# Patient Record
Sex: Female | Born: 1990 | Race: White | Hispanic: No | Marital: Single | Smoking: Never smoker
Health system: Southern US, Community
[De-identification: ages and names within clinical notes are randomized; demographics above are authoritative.]

## PROBLEM LIST (undated history)

## (undated) ENCOUNTER — Inpatient Hospital Stay: Payer: Self-pay

## (undated) DIAGNOSIS — J45909 Unspecified asthma, uncomplicated: Secondary | ICD-10-CM

## (undated) DIAGNOSIS — O210 Mild hyperemesis gravidarum: Secondary | ICD-10-CM

## (undated) DIAGNOSIS — E119 Type 2 diabetes mellitus without complications: Secondary | ICD-10-CM

## (undated) DIAGNOSIS — D649 Anemia, unspecified: Secondary | ICD-10-CM

## (undated) DIAGNOSIS — R519 Headache, unspecified: Secondary | ICD-10-CM

## (undated) DIAGNOSIS — R51 Headache: Secondary | ICD-10-CM

## (undated) HISTORY — PX: ABDOMINAL SURGERY: SHX537

---
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
## 2010-07-31 ENCOUNTER — Emergency Department: Payer: Self-pay | Admitting: Emergency Medicine

## 2012-02-20 ENCOUNTER — Ambulatory Visit: Payer: Self-pay | Admitting: Urology

## 2014-08-11 ENCOUNTER — Emergency Department: Payer: Self-pay | Admitting: Student

## 2014-08-11 LAB — URINALYSIS, COMPLETE
BILIRUBIN, UR: NEGATIVE
Glucose,UR: NEGATIVE mg/dL (ref 0–75)
Ketone: NEGATIVE
NITRITE: POSITIVE
Ph: 5 (ref 4.5–8.0)
Protein: 30
RBC,UR: 113 /HPF (ref 0–5)
Specific Gravity: 1.021 (ref 1.003–1.030)
Squamous Epithelial: 1

## 2014-08-11 LAB — CBC WITH DIFFERENTIAL/PLATELET
BASOS PCT: 0.5 %
Basophil #: 0.1 10*3/uL (ref 0.0–0.1)
EOS ABS: 0.1 10*3/uL (ref 0.0–0.7)
Eosinophil %: 0.8 %
HCT: 41.1 % (ref 35.0–47.0)
HGB: 13.1 g/dL (ref 12.0–16.0)
Lymphocyte #: 2.5 10*3/uL (ref 1.0–3.6)
Lymphocyte %: 17.2 %
MCH: 27.8 pg (ref 26.0–34.0)
MCHC: 31.9 g/dL — AB (ref 32.0–36.0)
MCV: 87 fL (ref 80–100)
MONO ABS: 1.1 x10 3/mm — AB (ref 0.2–0.9)
Monocyte %: 7.8 %
NEUTROS ABS: 10.8 10*3/uL — AB (ref 1.4–6.5)
Neutrophil %: 73.7 %
PLATELETS: 289 10*3/uL (ref 150–440)
RBC: 4.72 10*6/uL (ref 3.80–5.20)
RDW: 12.6 % (ref 11.5–14.5)
WBC: 14.6 10*3/uL — ABNORMAL HIGH (ref 3.6–11.0)

## 2014-08-11 LAB — BASIC METABOLIC PANEL
ANION GAP: 7 (ref 7–16)
BUN: 15 mg/dL (ref 7–18)
CO2: 25 mmol/L (ref 21–32)
CREATININE: 0.81 mg/dL (ref 0.60–1.30)
Calcium, Total: 9 mg/dL (ref 8.5–10.1)
Chloride: 107 mmol/L (ref 98–107)
EGFR (African American): >60
EGFR (Non-African Amer.): >60
Glucose: 89 mg/dL (ref 65–99)
OSMOLALITY: 278 (ref 275–301)
Potassium: 4.2 mmol/L (ref 3.5–5.1)
Sodium: 139 mmol/L (ref 136–145)

## 2014-08-12 ENCOUNTER — Ambulatory Visit: Payer: Self-pay | Admitting: Internal Medicine

## 2014-08-12 LAB — HEPATIC FUNCTION PANEL A (ARMC)
ALBUMIN: 4.2 g/dL (ref 3.4–5.0)
ALK PHOS: 69 U/L
AST: 29 U/L (ref 15–37)
BILIRUBIN TOTAL: 0.1 mg/dL — AB (ref 0.2–1.0)
SGPT (ALT): 17 U/L
Total Protein: 7.9 g/dL (ref 6.4–8.2)

## 2014-08-12 LAB — DRUG SCREEN, URINE
AMPHETAMINES, UR SCREEN: NEGATIVE (ref ?–1000)
BARBITURATES, UR SCREEN: NEGATIVE (ref ?–200)
BENZODIAZEPINE, UR SCRN: NEGATIVE (ref ?–200)
CANNABINOID 50 NG, UR ~~LOC~~: NEGATIVE (ref ?–50)
COCAINE METABOLITE, UR ~~LOC~~: NEGATIVE (ref ?–300)
MDMA (Ecstasy)Ur Screen: NEGATIVE (ref ?–500)
METHADONE, UR SCREEN: NEGATIVE (ref ?–300)
OPIATE, UR SCREEN: NEGATIVE (ref ?–300)
Phencyclidine (PCP) Ur S: NEGATIVE (ref ?–25)
Tricyclic, Ur Screen: NEGATIVE (ref ?–1000)

## 2014-08-12 LAB — URINALYSIS, COMPLETE
Bilirubin,UR: NEGATIVE
Glucose,UR: NEGATIVE
KETONE: NEGATIVE
Nitrite: POSITIVE
PH: 6 (ref 5.0–8.0)
Protein: 30
Specific Gravity: 1.025 (ref 1.000–1.030)

## 2014-08-15 LAB — URINE CULTURE

## 2014-12-12 ENCOUNTER — Ambulatory Visit: Payer: Self-pay | Admitting: Physician Assistant

## 2015-07-21 ENCOUNTER — Encounter: Payer: Self-pay | Admitting: Emergency Medicine

## 2015-07-21 ENCOUNTER — Ambulatory Visit
Admission: EM | Admit: 2015-07-21 | Discharge: 2015-07-21 | Disposition: A | Discharge status: Home / Self Care | Payer: Self-pay | Attending: Internal Medicine | Admitting: Internal Medicine

## 2015-07-21 DIAGNOSIS — R42 Dizziness and giddiness: Secondary | ICD-10-CM

## 2015-07-21 MED ORDER — MECLIZINE HCL 12.5 MG PO TABS: 25.0000 mg | ORAL_TABLET | Freq: Three times a day (TID) | ORAL | Status: DC | PRN

## 2015-07-21 NOTE — Discharge Instructions (Signed)
Prescription for antivert (meclizine) sent to the pharmacy. Recheck or followup Dr Greggory Stallion if not improving in a few days.  Dizziness Dizziness is a common problem. It is a feeling of unsteadiness or light-headedness. You may feel like you are about to faint. Dizziness can lead to injury if you stumble or fall. A person of any age group can suffer from dizziness, but dizziness is more common in older adults. CAUSES  Dizziness can be caused by many different things, including:  Middle ear problems.  Standing for too long.  Infections.  An allergic reaction.  Aging.  An emotional response to something, such as the sight of blood.  Side effects of medicines.  Tiredness.  Problems with circulation or blood pressure.  Excessive use of alcohol or medicines, or illegal drug use.  Breathing too fast (hyperventilation).  An irregular heart rhythm (arrhythmia).  A low red blood cell count (anemia).  Pregnancy.  Vomiting, diarrhea, fever, or other illnesses that cause body fluid loss (dehydration).  Diseases or conditions such as Parkinson's disease, high blood pressure (hypertension), diabetes, and thyroid problems.  Exposure to extreme heat. DIAGNOSIS  Your health care provider will ask about your symptoms, perform a physical exam, and perform an electrocardiogram (ECG) to record the electrical activity of your heart. Your health care provider may also perform other heart or blood tests to determine the cause of your dizziness. These may include:  Transthoracic echocardiogram (TTE). During echocardiography, sound waves are used to evaluate how blood flows through your heart.  Transesophageal echocardiogram (TEE).  Cardiac monitoring. This allows your health care provider to monitor your heart rate and rhythm in real time.  Holter monitor. This is a portable device that records your heartbeat and can help diagnose heart arrhythmias. It allows your health care provider to track  your heart activity for several days if needed.  Stress tests by exercise or by giving medicine that makes the heart beat faster. TREATMENT  Treatment of dizziness depends on the cause of your symptoms and can vary greatly. HOME CARE INSTRUCTIONS   Drink enough fluids to keep your urine clear or pale yellow. This is especially important in very hot weather. In older adults, it is also important in cold weather.  Take your medicine exactly as directed if your dizziness is caused by medicines. When taking blood pressure medicines, it is especially important to get up slowly.  Rise slowly from chairs and steady yourself until you feel okay.  In the morning, first sit up on the side of the bed. When you feel okay, stand slowly while holding onto something until you know your balance is fine.  Move your legs often if you need to stand in one place for a long time. Tighten and relax your muscles in your legs while standing.  Have someone stay with you for 1-2 days if dizziness continues to be a problem. Do this until you feel you are well enough to stay alone. Have the person call your health care provider if he or she notices changes in you that are concerning.  Do not drive or use heavy machinery if you feel dizzy.  Do not drink alcohol. SEEK IMMEDIATE MEDICAL CARE IF:   Your dizziness or light-headedness gets worse.  You feel nauseous or vomit.  You have problems talking, walking, or using your arms, hands, or legs.  You feel weak.  You are not thinking clearly or you have trouble forming sentences. It may take a friend or family member to  notice this.  You have chest pain, abdominal pain, shortness of breath, or sweating.  Your vision changes.  You notice any bleeding.  You have side effects from medicine that seems to be getting worse rather than better. MAKE SURE YOU:   Understand these instructions.  Will watch your condition.  Will get help right away if you are not  doing well or get worse. Document Released: 05/27/2001 Document Revised: 12/06/2013 Document Reviewed: 06/20/2011 [facility] Patient Information 2015 [place], [place]. This information is not intended to replace advice given to you by your health care provider. Make sure you discuss any questions you have with your health care provider.

## 2015-07-21 NOTE — ED Notes (Signed)
Patient presents here with c/o dizziness/ nausea since 2 days.

## 2015-07-21 NOTE — ED Provider Notes (Addendum)
CSN: [id number]     Arrival date & time 07/21/15  1358 History   First MD Initiated Contact with Patient 07/21/15 [phone]     Chief Complaint  Patient presents with  . Dizziness  . Nausea   HPI  Patient is a 24 year old lady who had the onset of dizziness yesterday. She describes this as being a sensation of kind of swimmy headedness, like being on a boat in the ocean with wave after wave. She also compares this to being like the feeling she hasn't she gets off a roller coaster. She woke up feeling dizzy in the middle of the night, severe at that time, less severe at the moment. She is able to get up and go to the bathroom, and felt more dizzy when she stood up from the toilet. Arms and legs are working, no weakness or clumsiness, was able to walk into the urgent care independently. No difficulty reported with speaking or swallowing. No fever, no rash, no achiness. Little bit of headache. Nausea, no vomiting, no abdominal pain. Change in bowel movements, no urinary difficulties reported. Patient has 2 small children at home, who have had recent respiratory symptoms and have been exposed to hand-foot-and-mouth disease  History reviewed. No pertinent past medical history. Past Surgical History  Procedure Laterality Date  . Cesarean section     gestational diabetes  History  Substance Use Topics  . Smoking status: Never Smoker   . Smokeless tobacco: Not on file  . Alcohol Use: Yes    Review of Systems  All other systems reviewed and are negative.   Allergies  Review of patient's allergies indicates no known allergies.  Home Medications   Prior to Admission medications   Medication Sig Start Date End Date Taking? Authorizing Provider  norethindrone-ethinyl estradiol-iron (ESTROSTEP FE,TILIA FE,TRI-LEGEST FE) 1-20/1-30/1-35 MG-MCG tablet Take 1 tablet by mouth daily.   Yes Historical Provider, MD          BP 117/70 mmHg  Pulse 62  Temp(Src) 96.8 F (36 C) (Tympanic)  Resp 20  Ht 5'  6" (1.676 m)  Wt 145 lb (65.772 kg)  BMI 23.41 kg/m2  SpO2 100%  LMP 07/18/2015 Physical Exam  Constitutional: She is oriented to person, place, and time. No distress.  Alert, nicely groomed Reclining on stretcher  HENT:  Head: Atraumatic.  Bilateral TMs moderately dull, no erythema Moderate left nasal congestion, marked right nasal congestion Slight erythema posterior pharynx  Eyes: Pupils are equal, round, and reactive to light.  Conjugate gaze, no eye redness/drainage  Neck: Neck supple.  No posterior midline percussion tenderness of the neck or back  Cardiovascular: Normal rate and regular rhythm.   Pulmonary/Chest: No respiratory distress. She has no wheezes. She has no rales.  Lungs clear, symmetric breath sounds  Abdominal: Soft. She exhibits no distension. There is no tenderness. There is no rebound and no guarding.  Musculoskeletal: Normal range of motion.  No leg swelling  Neurological: She is alert and oriented to person, place, and time.  Face symmetric, speech clear/coherent Dizziness does appear to be worse with head movement, eye movement  Skin: Skin is warm and dry.  No cyanosis  Nursing note and vitals reviewed.  Orthostatic VS for the past 24 hrs:  BP- Lying Pulse- Lying BP- Sitting Pulse- Sitting BP- Standing at 0 minutes Pulse- Standing at 0 minutes  07/21/15 1456 101/62 mmHg 56 117/70 mmHg 62 105/70 mmHg 64       ED Course  Procedures  MDM   1. Dizziness    Discharge Medication List as of 07/21/2015  3:33 PM    START taking these medications   Details  meclizine (ANTIVERT) 12.5 MG tablet Take 2 tablets (25 mg total) by mouth 3 (three) times daily as needed for dizziness or nausea., Starting 07/21/2015, Until Discontinued, Normal       Recheck or follow up Dr. Greggory Stallion if not improving in a few days. To ER for worsening symptoms    Eustace Moore, MD 07/21/15 1541  Eustace Moore, MD 07/21/15 [phone]

## 2016-03-02 ENCOUNTER — Emergency Department: Payer: Medicaid Other

## 2016-03-02 ENCOUNTER — Emergency Department
Admission: EM | Admit: 2016-03-02 | Discharge: 2016-03-02 | Disposition: A | Discharge status: Home / Self Care | Payer: Medicaid Other | Attending: Emergency Medicine | Admitting: Emergency Medicine

## 2016-03-02 ENCOUNTER — Encounter: Payer: Self-pay | Admitting: *Deleted

## 2016-03-02 DIAGNOSIS — O210 Mild hyperemesis gravidarum: Secondary | ICD-10-CM | POA: Diagnosis not present

## 2016-03-02 DIAGNOSIS — Z3A14 14 weeks gestation of pregnancy: Secondary | ICD-10-CM | POA: Diagnosis not present

## 2016-03-02 DIAGNOSIS — Z3492 Encounter for supervision of normal pregnancy, unspecified, second trimester: Secondary | ICD-10-CM

## 2016-03-02 DIAGNOSIS — R1110 Vomiting, unspecified: Secondary | ICD-10-CM | POA: Diagnosis present

## 2016-03-02 DIAGNOSIS — O368190 Decreased fetal movements, unspecified trimester, not applicable or unspecified: Secondary | ICD-10-CM

## 2016-03-02 HISTORY — DX: Mild hyperemesis gravidarum: O21.0

## 2016-03-02 LAB — URINALYSIS COMPLETE WITH MICROSCOPIC (ARMC ONLY)
Bilirubin Urine: NEGATIVE
GLUCOSE, UA: NEGATIVE mg/dL
NITRITE: POSITIVE — AB
Protein, ur: 30 mg/dL — AB
SPECIFIC GRAVITY, URINE: 1.015 (ref 1.005–1.030)
pH: 6 (ref 5.0–8.0)

## 2016-03-02 LAB — POCT PREGNANCY, URINE: Preg Test, Ur: POSITIVE — AB

## 2016-03-02 MED ORDER — NITROFURANTOIN MACROCRYSTAL 100 MG PO CAPS: 100.0000 mg | ORAL_CAPSULE | Freq: Four times a day (QID) | ORAL | Status: DC

## 2016-03-02 MED ORDER — SODIUM CHLORIDE 0.9 % IV BOLUS (SEPSIS)
1000.0000 mL | Freq: Once | INTRAVENOUS | Status: AC
  Administered 2016-03-02: 1000 mL via INTRAVENOUS

## 2016-03-02 MED ORDER — ONDANSETRON HCL 4 MG PO TABS: 4.0000 mg | ORAL_TABLET | Freq: Three times a day (TID) | ORAL | Status: DC | PRN

## 2016-03-02 MED ORDER — NITROFURANTOIN MONOHYD MACRO 100 MG PO CAPS
100.0000 mg | ORAL_CAPSULE | Freq: Once | ORAL | Status: AC
  Administered 2016-03-02: 100 mg via ORAL
  Filled 2016-03-02 (×2): qty 1

## 2016-03-02 MED ORDER — ONDANSETRON HCL 4 MG/2ML IJ SOLN
4.0000 mg | Freq: Once | INTRAMUSCULAR | Status: AC
  Administered 2016-03-02: 4 mg via INTRAVENOUS
  Filled 2016-03-02: qty 2

## 2016-03-02 NOTE — ED Notes (Signed)
This RN attempted to doppler fetal heart tones and was unable to get fetal heart tones using a doppler. This RN notified MD, MD ordered US at this time.

## 2016-03-02 NOTE — ED Provider Notes (Signed)
Time Seen: Approximately ----------------------------------------- 7:55 PM on 03/02/2016 -----------------------------------------   I have reviewed the triage notes  Chief Complaint: Emesis   History of Present Illness: Mary Suarez is a 25 y.o. female who states that she is [redacted] weeks pregnant. Patient's felt ill for the past week and was diagnosed with the influenza virus at an urgent care center yesterday. He states that she's had some nausea throughout most of her pregnancy but started vomiting 3 days ago. The patient states that she's had some loose stool and vomiting that started this morning. She denies any melena or hematochezia. Patient states that she's had a persistent cough and had some small amount of blood in her phlegm today. No persistent coughing up of blood and no shortness of breath. Patient states that she's had resolution of her fever. 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 The patient has been taking and anti-medic medication bedtime the medication was not familiar to me. Her other concern is that she hasn't felt any fetal movements recently. She states she did feel warm earlier in her pregnancy. She denies any history of complications with her pregnancies except for gestational diabetes. She states she is currently being followed by her OB/GYN. She has had a previous ultrasound during her pregnancy which did not show any evidence of complications.  Past Medical History  Diagnosis Date  . Morning sickness     There are no active problems to display for this patient.   Past Surgical History  Procedure Laterality Date  . Cesarean section      Past Surgical History  Procedure Laterality Date  . Cesarean section      Current Outpatient Rx  Name  Route  Sig  Dispense  Refill  . meclizine (ANTIVERT) 12.5 MG tablet   Oral   Take 2 tablets (25 mg total) by mouth 3 (three) times daily as needed for dizziness or  nausea.   30 tablet   0   . norethindrone-ethinyl estradiol-iron (ESTROSTEP FE,TILIA FE,TRI-LEGEST FE) 1-20/1-30/1-35 MG-MCG tablet   Oral   Take 1 tablet by mouth daily.           Allergies:  Phenergan  Family History: No family history on file.  Social History: Social History  Substance Use Topics  . Smoking status: Never Smoker   . Smokeless tobacco: None  . Alcohol Use: Yes     Review of Systems:   10 point review of systems was performed and was otherwise negative:  Constitutional: No fever Eyes: No visual disturbances ENT: No sore throat, ear pain Cardiac: No chest pain Respiratory: No shortness of breath, wheezing, or stridor Abdomen: No abdominal pain, no vomiting, No diarrhea Endocrine: No weight loss, No night sweats Extremities: No peripheral edema, cyanosis Skin: No rashes, easy bruising Neurologic: No focal weakness, trouble with speech or swollowing Urologic: No dysuria, Hematuria, or urinary frequency Patient denies any vaginal discharge or bleeding  Physical Exam:  ED Triage Vitals  Enc Vitals Group     BP 03/02/16 1848 118/75 mmHg     Pulse Rate 03/02/16 1848 99     Resp 03/02/16 1848 18     Temp 03/02/16 1848 98.5 F (36.9 C)     Temp Source 03/02/16 1848 Oral     SpO2 03/02/16 1848 98 %     Weight 03/02/16 1848 155 lb (70.308 kg)     Height 03/02/16 1848 5\' 6"  (1.676 m)     Head  Cir --      Peak Flow --      Pain Score 03/02/16 1850 3     Pain Loc --      Pain Edu? --      Excl. in GC? --     General: Awake , Alert , and Oriented times 3; GCS 15 nonproductive cough. No signs of respiratory distress Head: Normal cephalic , atraumatic Eyes: Pupils equal , round, reactive to light Nose/Throat: No nasal drainage, patent upper airway without erythema or exudate.  Neck: Supple, Full range of motion, No anterior adenopathy or palpable thyroid masses Lungs: Clear to ascultation without wheezes , rhonchi, or rales Heart: Regular rate,  regular rhythm without murmurs , gallops , or rubs Abdomen: Soft, non tender without rebound, guarding , or rigidity; bowel sounds positive and symmetric in all 4 quadrants. No organomegaly .        Extremities: 2 plus symmetric pulses. No edema, clubbing or cyanosis Neurologic: normal ambulation, Motor symmetric without deficits, sensory intact Skin: warm, dry, no rashes   Labs:   Patient's pregnancy test was positive and appears to have a urinary tract infection with many bacteria and positive nitrites  Radiology:    Narrative:    CLINICAL DATA: Vomiting for 3 days. Decreased fetal movement. Estimated gestational age by LMP is 14 weeks 5 days.  EXAM: LIMITED OBSTETRIC ULTRASOUND  FINDINGS: Number of Fetuses: 1  Heart Rate: 157 bpm  Movement: Yes  Presentation: Fetus is in cephalic presentation.  Placental Location: Anterior  Previa: No  Amniotic Fluid (Subjective): Within normal limits.  BPD: 2.9cm 15w tod  MATERNAL FINDINGS:  Cervix: Appears closed. Length measures 5.1 cm.  Uterus/Adnexae: Posterior contraction is suggested. No visualized myometrial masses. Limited visualization of both ovaries without evidence of abnormal adnexal mass.  IMPRESSION: Single intrauterine pregnancy with fetus in cephalic presentation. No acute complication demonstrated on limited imaging.  This exam is performed on an emergent basis and does not comprehensively evaluate fetal size, dating, or anatomy; follow-up complete OB US should be considered if further fetal assessment is warranted.     I personally reviewed the radiologic studies      ED Course: Patient's stay here was uneventful and the patient's ultrasound did not show any abnormalities with her pregnancy. Her laboratory work shows a urinary tract infection and given that she is in her second trimester of pregnancy I felt she should be started on oral antibiotics. Patient was given macrodantoin  here  and she states that she normally has urinary tract infections with her pregnancy. She'll be discharged on macrodantoin  and a urine culture is pending. The patient was advised to continue with the Tamiflu at her own discretion as this may also be contributing to her nausea and vomiting. She was advised to follow up with her OB/GYN as directed   Assessment:  Urinary tract infection Nausea and vomiting Influenza     Plan:  Outpatient management Patient was prescribed Zofran which was effective here for nausea and vomiting. She is otherwise hemodynamically stable and is advised follow-up as directed with her OB/GYN with urine culture pending Patient was advised to return immediately if condition worsens. Patient was advised to follow up with their primary care physician or other specialized physicians involved in their outpatient care. The patient and/or family member/power of attorney had laboratory results reviewed at the bedside. All questions and concerns were addressed and appropriate discharge instructions were distributed by the nursing staff.  Jennye Moccasin, MD 03/02/16 [phone]

## 2016-03-02 NOTE — ED Notes (Signed)
Patient states she was diagnosed with the flu yesterday at Urgent Care and started Tamiflu yesterday and has had 3 doses. Patient c/o vomiting and diarrhea that began this morning. Patient states she has been vomiting for 3 days, but is worse today. Patient is 15-week pregnant and states she hasn't felt the baby move all day. Patient states she also has seen blood in the phlegm today.

## 2016-03-02 NOTE — ED Notes (Signed)
NAD noted at time of D/C. Pt denies questions or concerns. Pt ambulatory to the lobby at this time.  

## 2016-03-02 NOTE — Discharge Instructions (Signed)
Morning Sickness Morning sickness is when you feel sick to your stomach (nauseous) during pregnancy. This nauseous feeling may or may not come with vomiting. It often occurs in the morning but can be a problem any time of day. Morning sickness is most common during the first trimester, but it may continue throughout pregnancy. While morning sickness is unpleasant, it is usually harmless unless you develop severe and continual vomiting (hyperemesis gravidarum). This condition requires more intense treatment.  CAUSES  The cause of morning sickness is not completely known but seems to be related to normal hormonal changes that occur in pregnancy. RISK FACTORS You are at greater risk if you:  Experienced nausea or vomiting before your pregnancy.  Had morning sickness during a previous pregnancy.  Are pregnant with more than one baby, such as twins. TREATMENT  Do not use any medicines (prescription, over-the-counter, or herbal) for morning sickness without first talking to your health care provider. Your health care provider may prescribe or recommend:  Vitamin B6 supplements.  Anti-nausea medicines.  The herbal medicine ginger. HOME CARE INSTRUCTIONS   Only take over-the-counter or prescription medicines as directed by your health care provider.  Taking multivitamins before getting pregnant can prevent or decrease the severity of morning sickness in most women.  Eat a piece of dry toast or unsalted crackers before getting out of bed in the morning.  Eat five or six small meals a day.  Eat dry and bland foods (rice, baked potato). Foods high in carbohydrates are often helpful.  Do not drink liquids with your meals. Drink liquids between meals.  Avoid greasy, fatty, and spicy foods.  Get someone to cook for you if the smell of any food causes nausea and vomiting.  If you feel nauseous after taking prenatal vitamins, take the vitamins at night or with a snack.  Snack on protein  foods (nuts, yogurt, cheese) between meals if you are hungry.  Eat unsweetened gelatins for desserts.  Wearing an acupressure wristband (worn for sea sickness) may be helpful.  Acupuncture may be helpful.  Do not smoke.  Get a humidifier to keep the air in your house free of odors.  Get plenty of fresh air. SEEK MEDICAL CARE IF:   Your home remedies are not working, and you need medicine.  You feel dizzy or lightheaded.  You are losing weight. SEEK IMMEDIATE MEDICAL CARE IF:   You have persistent and uncontrolled nausea and vomiting.  You pass out (faint). MAKE SURE YOU:  Understand these instructions.  Will watch your condition.  Will get help right away if you are not doing well or get worse.   This information is not intended to replace advice given to you by your health care provider. Make sure you discuss any questions you have with your health care provider.   Document Released: 01/22/2007 Document Revised: 12/06/2013 Document Reviewed: 05/18/2013 Elsevier Interactive Patient Education 2016 [employer]  Pregnancy and Influenza Influenza, also called the flu, is an infection of the respiratory tract. If you are pregnant, you are more likely to catch the flu. You are also more likely to have a more serious case of the flu. This is because pregnancy lowers your body's ability to fight off infections (it weakens your immune system). It also puts additional stress on your heart and lungs, which makes you more likely to have complications. Having a bad case of the flu, especially with a high fever, can be dangerous for your developing baby. It can cause you  to go into early labor. HOW DO PEOPLE GET THE FLU? The flu is caused by the influenza virus. This virus is common every year in the fall and winter. It spreads when virus particles get passed from person to person. You can get the virus if you are near a sick person who is coughing or sneezing. You can also get the  virus if you touch something that has the virus on it and then touch your face. HOW CAN I PROTECT MYSELF AGAINST THE FLU?  Get a flu shot. The best way to prevent the flu is to get a flu shot before flu season starts. The flu shot is not dangerous for your developing baby. It may even help protect your baby from the flu for up to 6 months after birth. The flu shot is one type of flu vaccine. Another type is a nasal spray vaccine. Do not get the nasal spray vaccine. It is not approved for pregnancy.  Do not come in close contact with sick people.  Do not share food, drinks, or utensils with other people.  Wash your hands often. Use hand sanitizer when soap and water are not available. WHAT SHOULD I DO IF I HAVE FLU SYMPTOMS? If you have any flu symptoms, call your health care provider right away. Flu symptoms include:  Fever or chills.  Muscle aches.  Headache.  Sore throat.  Nasal congestion.  Cough.  Feeling tired.  Loss of appetite.  Vomiting.  Diarrhea. You may be able to take an antiviral medicine to keep the flu from becoming severe and to shorten how long it lasts. WHAT SHOULD I DO AT HOME IF I AM DIAGNOSED WITH THE FLU?  Do not take any medicine, including cold or flu medicine, unless directed by your health care provider.  If you take antiviral medicine, make sure you finish it even if you start to feel better.  Drink enough fluid to keep your urine clear or pale yellow.  Get plenty of rest. WHEN WOULD I SEEK IMMEDIATE MEDICAL CARE IF I HAVE THE FLU?  You have trouble breathing.  You have chest pain.  You begin to have labor pains.  You have a high fever that does not go down after you take medicine.  You do not feel your baby move.  You have diarrhea or vomiting that will not go away.   This information is not intended to replace advice given to you by your health care provider. Make sure you discuss any questions you have with your health care  provider.   Document Released: 10/03/2008 Document Revised: 12/06/2013 Document Reviewed: 10/28/2013 Elsevier Interactive Patient Education [employer]

## 2016-03-05 LAB — URINE CULTURE

## 2016-05-10 ENCOUNTER — Observation Stay
Admission: EM | Admit: 2016-05-10 | Discharge: 2016-05-10 | Disposition: A | Discharge status: Home / Self Care | Payer: Medicaid Other | Attending: Advanced Practice Midwife | Admitting: Advanced Practice Midwife

## 2016-05-10 DIAGNOSIS — Z3A24 24 weeks gestation of pregnancy: Secondary | ICD-10-CM | POA: Insufficient documentation

## 2016-05-10 DIAGNOSIS — R112 Nausea with vomiting, unspecified: Secondary | ICD-10-CM | POA: Insufficient documentation

## 2016-05-10 DIAGNOSIS — O34219 Maternal care for unspecified type scar from previous cesarean delivery: Secondary | ICD-10-CM | POA: Insufficient documentation

## 2016-05-10 DIAGNOSIS — R197 Diarrhea, unspecified: Secondary | ICD-10-CM | POA: Insufficient documentation

## 2016-05-10 DIAGNOSIS — O9989 Other specified diseases and conditions complicating pregnancy, childbirth and the puerperium: Principal | ICD-10-CM | POA: Insufficient documentation

## 2016-05-10 DIAGNOSIS — Z888 Allergy status to other drugs, medicaments and biological substances status: Secondary | ICD-10-CM | POA: Insufficient documentation

## 2016-05-10 DIAGNOSIS — R110 Nausea: Secondary | ICD-10-CM | POA: Diagnosis present

## 2016-05-10 LAB — COMPREHENSIVE METABOLIC PANEL
ALT: 10 U/L — AB (ref 14–54)
AST: 16 U/L (ref 15–41)
Albumin: 3.3 g/dL — ABNORMAL LOW (ref 3.5–5.0)
Alkaline Phosphatase: 63 U/L (ref 38–126)
Anion gap: 7 (ref 5–15)
BUN: 7 mg/dL (ref 6–20)
CHLORIDE: 106 mmol/L (ref 101–111)
CO2: 23 mmol/L (ref 22–32)
Calcium: 8.9 mg/dL (ref 8.9–10.3)
Creatinine, Ser: 0.45 mg/dL (ref 0.44–1.00)
GFR calc Af Amer: >60 mL/min (ref >60)
GLUCOSE: 94 mg/dL (ref 65–99)
POTASSIUM: 3.9 mmol/L (ref 3.5–5.1)
Sodium: 136 mmol/L (ref 135–145)
Total Bilirubin: 0.4 mg/dL (ref 0.3–1.2)
Total Protein: 7 g/dL (ref 6.5–8.1)

## 2016-05-10 LAB — URINALYSIS COMPLETE WITH MICROSCOPIC (ARMC ONLY)
Bilirubin Urine: NEGATIVE
Glucose, UA: NEGATIVE mg/dL
KETONES UR: NEGATIVE mg/dL
Nitrite: NEGATIVE
PH: 7 (ref 5.0–8.0)
Protein, ur: NEGATIVE mg/dL
Specific Gravity, Urine: 1.008 (ref 1.005–1.030)

## 2016-05-10 MED ORDER — LACTATED RINGERS IV SOLN
INTRAVENOUS | Status: DC
  Administered 2016-05-10: 09:00:00 via INTRAVENOUS

## 2016-05-10 MED ORDER — ONDANSETRON HCL 4 MG/2ML IJ SOLN
4.0000 mg | INTRAMUSCULAR | Status: DC | PRN
  Administered 2016-05-10: 4 mg via INTRAVENOUS
  Filled 2016-05-10: qty 2

## 2016-05-10 MED ORDER — ACETAMINOPHEN 325 MG PO TABS
650.0000 mg | ORAL_TABLET | ORAL | Status: DC | PRN
  Administered 2016-05-10: 650 mg via ORAL
  Filled 2016-05-10: qty 2

## 2016-05-10 NOTE — Discharge Summary (Signed)
Obstetric History and Physical  Mary Suarez is a 25 y.o. [phone] with Estimated Date of Delivery: 08/26/16  who presents at [redacted]w[redacted]d  presenting for c/o nausea, vomiting and diarrhea since last night. Believes she has food poisoning as sx started after eating. Patient states she has been having no contractions, no vaginal bleeding, intact membranes, with active fetal movement.    Prenatal Course Source of Care: [facility]  with onset of care at 5 weeks Pregnancy complications or risks: Patient Active Problem List   Diagnosis Date Noted  . Nausea 05/10/2016   Prenatal Transfer Tool   Past Medical History  Diagnosis Date  . GDM with G1     Past Surgical History  Procedure Laterality Date  . Cesarean section      OB History  Gravida Para Term Preterm AB SAB TAB Ectopic Multiple Living  0 0 0 0 0 0 2    # Outcome Date GA Lbr Len/2nd Weight Sex Delivery Anes PTL Lv    Social History   Social History  . Marital Status: Single    Spouse Name: N/A  . Number of Children: N/A  . Years of Education: N/A   Social History Main Topics  . Smoking status: Never Smoker   . Smokeless tobacco: Not on file  . Alcohol Use: Yes  . Drug Use: No  . Sexual Activity: Not on file   Other Topics Concern  . Not on file   Social History Narrative    No family history on file.  No prescriptions prior to admission    Allergies  Allergen Reactions  . Phenergan [Promethazine Hcl] Other (See Comments)    Seizures    Review of Systems: Negative except for what is mentioned in HPI.  Physical Exam: BP 115/71 mmHg  Pulse 82  Temp(Src) 98.1 F (36.7 C) (Oral)  Resp 16  LMP 11/20/2015 (Approximate) GENERAL: Appeared uncomfortable on presentation, much improved by discharge FHT: Category:+FHTs Contractions: none   Pertinent Labs/Studies:   Results for orders placed or performed during the hospital encounter of 05/10/16 (from the past 24 hour(s))  Urinalysis complete, with  microscopic ([facility] only)     Status: Abnormal   Collection Time: 05/10/16  9:00 AM  Result Value Ref Range   Color, Urine YELLOW (A) YELLOW   APPearance HAZY (A) CLEAR   Glucose, UA NEGATIVE NEGATIVE mg/dL   Bilirubin Urine NEGATIVE NEGATIVE   Ketones, ur NEGATIVE NEGATIVE mg/dL   Specific Gravity, Urine 1.008 1.005 - 1.030   Hgb urine dipstick 2+ (A) NEGATIVE   pH 7.0 5.0 - 8.0   Protein, ur NEGATIVE NEGATIVE mg/dL   Nitrite NEGATIVE NEGATIVE   Leukocytes, UA 2+ (A) NEGATIVE   RBC / HPF 6-30 0 - 5 RBC/hpf   WBC, UA TOO NUMEROUS TO COUNT 0 - 5 WBC/hpf   Bacteria, UA RARE (A) NONE SEEN   Squamous Epithelial / LPF 6-30 (A) NONE SEEN   Mucous PRESENT   Comprehensive metabolic panel     Status: Abnormal   Collection Time: 05/10/16  9:20 AM  Result Value Ref Range   Sodium 136 135 - 145 mmol/L   Potassium 3.9 3.5 - 5.1 mmol/L   Chloride 106 101 - 111 mmol/L   CO2 23 22 - 32 mmol/L   Glucose, Bld 94 65 - 99 mg/dL   BUN 7 6 - 20 mg/dL   Creatinine, Ser 9.81 0.44 - 1.00 mg/dL   Calcium 8.9 8.9 - 19.1 mg/dL  Total Protein 7.0 6.5 - 8.1 g/dL   Albumin 3.3 (L) 3.5 - 5.0 g/dL   AST 16 15 - 41 U/L   ALT 10 (L) 14 - 54 U/L   Alkaline Phosphatase 63 38 - 126 U/L   Total Bilirubin 0.4 0.3 - 1.2 mg/dL   GFR calc non Af Amer >60 >60 mL/min   GFR calc Af Amer >60 >60 mL/min   Anion gap 7 5 - 15    Assessment : IUP at 671w4d, gastroenteritis vs food poisoning,   Plan: Pt received IV fluid bolus and IV zofran. Prior to discharge able to tolerate po. D/c home with Rx for Zofran po. Instructed to slowly advance po as tolerated To f/u as scheduled at 28 week visit or sooner prn.  Urine culture ordered - will follow per results.

## 2016-05-10 NOTE — Plan of Care (Signed)
Discharge instructions, both oral and written, given to pt.  Labor precations and dietary discussion completed.  She verbalized understanding of plan of care per CNM and RN discussion. Pt ready to leave dept in stable condition via wheelchair to home with her father.  Pt has next appt June 20 with [facility].  Ellison Carwin Zong Mcquarrie RNC

## 2016-05-10 NOTE — Discharge Instructions (Signed)
Keep scheduled next [facility] June [facility]  Please call if any questions or concerns. Eat bland food for next 24 hours and stay well hydrated with fluids especially water.  Birthplace number if any questions:  [phone]  Mary CarwinC Shanquita Ronning RNC

## 2016-05-10 NOTE — OB Triage Note (Signed)
[name] here with c/o N/V/D and low back pain with painful urination. Pt reports frequent UTIs with previous pregnancies. Reports multiple episodes of vomiting and diarrhea.

## 2016-05-12 LAB — URINE CULTURE

## 2016-06-21 ENCOUNTER — Observation Stay: Payer: Medicaid Other

## 2016-06-21 ENCOUNTER — Observation Stay
Admission: EM | Admit: 2016-06-21 | Discharge: 2016-06-23 | Disposition: A | Discharge status: Home / Self Care | Payer: Medicaid Other | Attending: Obstetrics & Gynecology | Admitting: Obstetrics & Gynecology

## 2016-06-21 ENCOUNTER — Encounter: Payer: Self-pay | Admitting: *Deleted

## 2016-06-21 DIAGNOSIS — R109 Unspecified abdominal pain: Secondary | ICD-10-CM | POA: Diagnosis not present

## 2016-06-21 DIAGNOSIS — Z79899 Other long term (current) drug therapy: Secondary | ICD-10-CM | POA: Insufficient documentation

## 2016-06-21 DIAGNOSIS — R197 Diarrhea, unspecified: Secondary | ICD-10-CM | POA: Insufficient documentation

## 2016-06-21 DIAGNOSIS — O26893 Other specified pregnancy related conditions, third trimester: Secondary | ICD-10-CM | POA: Diagnosis not present

## 2016-06-21 DIAGNOSIS — Z3A30 30 weeks gestation of pregnancy: Secondary | ICD-10-CM | POA: Diagnosis not present

## 2016-06-21 DIAGNOSIS — O2343 Unspecified infection of urinary tract in pregnancy, third trimester: Secondary | ICD-10-CM | POA: Insufficient documentation

## 2016-06-21 DIAGNOSIS — R110 Nausea: Secondary | ICD-10-CM | POA: Insufficient documentation

## 2016-06-21 DIAGNOSIS — R1031 Right lower quadrant pain: Secondary | ICD-10-CM | POA: Diagnosis present

## 2016-06-21 LAB — COMPREHENSIVE METABOLIC PANEL
ALK PHOS: 87 U/L (ref 38–126)
ALT: 10 U/L — AB (ref 14–54)
AST: 18 U/L (ref 15–41)
Albumin: 3.1 g/dL — ABNORMAL LOW (ref 3.5–5.0)
Anion gap: 9 (ref 5–15)
BILIRUBIN TOTAL: 0.4 mg/dL (ref 0.3–1.2)
BUN: 8 mg/dL (ref 6–20)
CALCIUM: 8.5 mg/dL — AB (ref 8.9–10.3)
CHLORIDE: 107 mmol/L (ref 101–111)
CO2: 21 mmol/L — ABNORMAL LOW (ref 22–32)
CREATININE: 0.44 mg/dL (ref 0.44–1.00)
Glucose, Bld: 96 mg/dL (ref 65–99)
Potassium: 3.6 mmol/L (ref 3.5–5.1)
Sodium: 137 mmol/L (ref 135–145)
TOTAL PROTEIN: 6.7 g/dL (ref 6.5–8.1)

## 2016-06-21 LAB — URINALYSIS COMPLETE WITH MICROSCOPIC (ARMC ONLY)
BILIRUBIN URINE: NEGATIVE
GLUCOSE, UA: NEGATIVE mg/dL
KETONES UR: NEGATIVE mg/dL
Nitrite: NEGATIVE
Protein, ur: NEGATIVE mg/dL
Specific Gravity, Urine: 1.008 (ref 1.005–1.030)
pH: 7 (ref 5.0–8.0)

## 2016-06-21 LAB — C DIFFICILE QUICK SCREEN W PCR REFLEX
C DIFFICILE (CDIFF) INTERP: NOT DETECTED
C DIFFICILE (CDIFF) TOXIN: NEGATIVE
C Diff antigen: NEGATIVE

## 2016-06-21 LAB — GASTROINTESTINAL PANEL BY PCR, STOOL (REPLACES STOOL CULTURE)
Adenovirus F40/41: NOT DETECTED
Astrovirus: NOT DETECTED
CYCLOSPORA CAYETANENSIS: NOT DETECTED
Campylobacter species: NOT DETECTED
Cryptosporidium: NOT DETECTED
E. COLI O157: NOT DETECTED
ENTAMOEBA HISTOLYTICA: NOT DETECTED
Enteroaggregative E coli (EAEC): NOT DETECTED
Enteropathogenic E coli (EPEC): NOT DETECTED
Enterotoxigenic E coli (ETEC): NOT DETECTED
Giardia lamblia: NOT DETECTED
Norovirus GI/GII: NOT DETECTED
Plesimonas shigelloides: NOT DETECTED
ROTAVIRUS A: NOT DETECTED
SAPOVIRUS (I, II, IV, AND V): NOT DETECTED
SHIGA LIKE TOXIN PRODUCING E COLI (STEC): NOT DETECTED
SHIGELLA/ENTEROINVASIVE E COLI (EIEC): NOT DETECTED
Salmonella species: NOT DETECTED
VIBRIO SPECIES: NOT DETECTED
Vibrio cholerae: NOT DETECTED
YERSINIA ENTEROCOLITICA: NOT DETECTED

## 2016-06-21 LAB — CBC
HEMATOCRIT: 32.5 % — AB (ref 35.0–47.0)
Hemoglobin: 11.2 g/dL — ABNORMAL LOW (ref 12.0–16.0)
MCH: 27.4 pg (ref 26.0–34.0)
MCHC: 34.4 g/dL (ref 32.0–36.0)
MCV: 79.7 fL — AB (ref 80.0–100.0)
PLATELETS: 164 10*3/uL (ref 150–440)
RBC: 4.08 MIL/uL (ref 3.80–5.20)
RDW: 14.3 % (ref 11.5–14.5)
WBC: 17.6 10*3/uL — ABNORMAL HIGH (ref 3.6–11.0)

## 2016-06-21 LAB — TYPE AND SCREEN
ABO/RH(D): O POS
Antibody Screen: NEGATIVE

## 2016-06-21 LAB — MAGNESIUM: MAGNESIUM: 1.7 mg/dL (ref 1.7–2.4)

## 2016-06-21 MED ORDER — METRONIDAZOLE 500 MG PO TABS
500.0000 mg | ORAL_TABLET | Freq: Three times a day (TID) | ORAL | Status: DC
  Administered 2016-06-21 – 2016-06-23 (×6): 500 mg via ORAL
  Filled 2016-06-21 (×6): qty 1

## 2016-06-21 MED ORDER — CEFTRIAXONE SODIUM 1 G IJ SOLR
1.0000 g | INTRAMUSCULAR | Status: DC
  Administered 2016-06-21 – 2016-06-23 (×3): 1 g via INTRAVENOUS
  Filled 2016-06-21 (×3): qty 10

## 2016-06-21 MED ORDER — ONDANSETRON HCL 4 MG/2ML IJ SOLN
4.0000 mg | Freq: Four times a day (QID) | INTRAMUSCULAR | Status: DC | PRN
  Administered 2016-06-21 (×3): 4 mg via INTRAVENOUS
  Filled 2016-06-21 (×3): qty 2

## 2016-06-21 MED ORDER — LACTATED RINGERS IV SOLN
INTRAVENOUS | Status: DC
  Administered 2016-06-21 – 2016-06-23 (×5): via INTRAVENOUS

## 2016-06-21 MED ORDER — ACETAMINOPHEN 500 MG PO TABS
1000.0000 mg | ORAL_TABLET | Freq: Four times a day (QID) | ORAL | Status: DC | PRN
  Administered 2016-06-21 – 2016-06-22 (×2): 1000 mg via ORAL
  Filled 2016-06-21 (×2): qty 2

## 2016-06-21 NOTE — H&P (Signed)
Obstetrics Admission History & Physical   Abdominal Pain   HPI:  25 y.o. G1P0 @ 7194w4d (08/26/2016, by Last Menstrual Period). Admitted on 06/21/2016:   Patient Active Problem List   Diagnosis Date Noted  . Right lower quadrant abdominal pain 06/21/2016  . Nausea 05/10/2016    Presents for pain in abdomen which is right sided; nausea and diarrhea this week as well as UTI being treated for.  Seen in office, on Immodium for diarrhea without help.  Pain now RLQ and somewhat RUQ.  Good FM.  No radiation, rebound or guarding.  No modifiers.  [facility]  PMHx:  Past Medical History  Diagnosis Date  . Morning sickness    PSHx:  Past Surgical History  Procedure Laterality Date  . Cesarean section     Medications:  Prescriptions prior to admission  Medication Sig Dispense Refill Last Dose  . loperamide (IMODIUM) 1 MG/5ML solution Take by mouth as needed for diarrhea or loose stools.   06/20/2016 at Unknown time  . nitrofurantoin (MACRODANTIN) 100 MG capsule Take 1 capsule (100 mg total) by mouth 4 (four) times daily. 28 capsule 0 06/20/2016  . norethindrone-ethinyl estradiol-iron (ESTROSTEP FE,TILIA FE,TRI-LEGEST FE) 1-20/1-30/1-35 MG-MCG tablet Take 1 tablet by mouth daily.   06/20/2016  . ranitidine (ZANTAC) 150 MG tablet Take 150 mg by mouth 2 (two) times daily.   06/20/2016 at Unknown time  . ondansetron (ZOFRAN) 4 MG tablet Take 1 tablet (4 mg total) by mouth every 8 (eight) hours as needed for nausea or vomiting. (Patient not taking: Reported on 06/21/2016) 21 tablet 0 Not Taking at Unknown time   Allergies: is allergic to phenergan. OBHx:  OB History  Gravida Para Term Preterm AB SAB TAB Ectopic Multiple Living  1             # Outcome Date GA Lbr Len/2nd Weight Sex Delivery Anes PTL Lv  1 Current              WJX:BJYNWGNF/[id number]:Negative/unremarkable except as detailed in HPI. Soc Hx: Pregnancy welcomed  Objective:   Filed Vitals:   06/21/16 0339  BP: 123/72  Pulse: 86  Temp:  98.1 F (36.7 C)  Resp: 16   General: Well nourished, well developed female in no acute distress.  Skin: Warm and dry.  Cardiovascular:Regular rate and rhythm. Respiratory: Clear to auscultation bilateral. Normal respiratory effort Abdomen: moderate, without guarding, without rebound, Gravid 30 weeks with no Uterine tenderness; pain is localized clearly to right side lateral to uterus both upper and lower quadrants Back: No CVAT Neuro/Psych: Normal mood and affect.   Pelvic exam: is not limited by body habitus EGBUS: within normal limits Vagina: within normal limits and with normal mucosa blood in the vault Cervix: CL/TH/HI Uterus: No contractions observed for 30 minutes.  Adnexa: not evaluated  EFM:FHR: 140 bpm Toco: None   Perinatal info:  Blood type: O positive Rubella- Immune Varicella -Immune TDaP Given during third trimester of this pregnancy RPR NR / HIV Neg/ HBsAg Neg   Assessment & Plan:   25 y.o. G1P0 @ 6694w4d, Admitted on 06/21/2016:  RIGHT LOWER QUADRANT ABDOMINAL PAIN with UPPER QUADRANT COMPONENT AS WELL, ASSOC w NAUSEA and DIARRHEA.  Concern for other etiology such as appendicitis.  Gastroenteritis a possibility but protracted long course all week.  No s/sx PTL.  Labs, MRI or CT (will have radiology assist with best option at this point). IVF, NPO, Zofran. Consider gen surg eval.

## 2016-06-21 NOTE — Progress Notes (Signed)
Left message for radiology for pt scan that is ordered.

## 2016-06-21 NOTE — Progress Notes (Signed)
Ms Jennette Kettleeal has been in our L&D triage/observation room since this AM, came in for diarrhea/RLQ pain x 1 week with no response to Immodium.  She had had chills but no fevers during this time.  She denies this long a term of diarrhea in the past outside of pregnancy. no bloody stools.  MRI was ordered to assess appendix.  Per radiology, the terminal ileum and appendix are normal appearing and there is no acute appendicitis.  Small bowel and colon also appear WNL. Zofran has been administered and has helped with her nausea.  She is able to tolerate food and PO intake.  Since being here baby has been on the monitor and persistently Category1 tracing with accels.  Plan:  This sounds like a colitis type picture now that appendicitis has been ruled out.   1. Discontinue fetal monitoring and transfer patient to [facility] for continued observation. 2. Collect stool for culture including C.Diff - unlikely but must be on differential.  Set up room for enteric precautions until ruled out. 3. Start Flagyl 500mg  PO TID for presumed colitis.  4. Regular diet 5. Continue OBS status for now.  If stays >24hr will make inpatient.  ----- Ranae Plumberhelsea Lauryl Seyer, MD Attending Obstetrician and Gynecologist [facility]

## 2016-06-21 NOTE — Plan of Care (Signed)
Problem: Physical Regulation: Goal: Will remain free from infection Outcome: Not Progressing Diarrhea continues  Problem: Fluid Volume: Goal: Ability to maintain a balanced intake and output will improve Outcome: Not Progressing Still requiring IVF secondary to Diarrhea  Problem: Nutrition: Goal: Adequate nutrition will be maintained Outcome: Not Progressing On Reg. Diet but continues with diarrhea  Problem: Bowel/Gastric: Goal: Will not experience complications related to bowel motility Outcome: Not Progressing Diarrhea

## 2016-06-21 NOTE — OB Triage Note (Signed)
Recvd pt from ED. Pt c/o sharp shooting pain in her upper right quadrant. Has had N/V/D since Sunday.

## 2016-06-22 DIAGNOSIS — O26893 Other specified pregnancy related conditions, third trimester: Secondary | ICD-10-CM | POA: Diagnosis not present

## 2016-06-22 LAB — CBC
HEMATOCRIT: 28.7 % — AB (ref 35.0–47.0)
HEMOGLOBIN: 9.7 g/dL — AB (ref 12.0–16.0)
MCH: 27 pg (ref 26.0–34.0)
MCHC: 34 g/dL (ref 32.0–36.0)
MCV: 79.5 fL — AB (ref 80.0–100.0)
PLATELETS: 154 10*3/uL (ref 150–440)
RBC: 3.6 MIL/uL — AB (ref 3.80–5.20)
RDW: 14.3 % (ref 11.5–14.5)
WBC: 12.1 10*3/uL — ABNORMAL HIGH (ref 3.6–11.0)

## 2016-06-22 MED ORDER — BUTALBITAL-APAP-CAFFEINE 50-325-40 MG PO TABS
2.0000 | ORAL_TABLET | ORAL | Status: DC | PRN
  Administered 2016-06-22 (×2): 2 via ORAL
  Filled 2016-06-22 (×2): qty 2

## 2016-06-22 NOTE — Plan of Care (Signed)
Problem: Fluid Volume: Goal: Ability to maintain a balanced intake and output will improve Outcome: Progressing Diarrhea is improving per Pt. Statement. WBC has decreased to 12.1

## 2016-06-22 NOTE — Progress Notes (Signed)
ANTEPARTUM PROGRESS NOTE  Mary Suarez is a 25 y.o. G3P2002 at 25w5dwith EDC: 08/26/16 who is admitted for persistent diarrhea and abdominal pan  Length of Stay:   Days. Admitted 06/21/2016  Subjective: She feels better this morning, not in as much abdominal pain, having less frequent bowel movements, but still watery diarrhea.  No vomiting since being moved to the floor, had a full meal last night.  States she has some blood in her urine.  She has a splitting right-sided frontal headache. Patient reports good fetal movement.  No uterine contractions, no bleeding and no loss of fluid per vagina.  Vitals:  BP 92/51 mmHg  Pulse 78  Temp(Src) 97.8 F (36.6 C) (Oral)  Resp 18  SpO2 99%  Physical Examination: CONSTITUTIONAL: Well-developed, well-nourished female in no acute distress.  HENT:  Normocephalic, atraumatic, External right and left ear normal. Oropharynx is clear and moist EYES: Conjunctivae and EOM are normal. Pupils are equal, round, and reactive to light. No scleral icterus.  NECK: Normal range of motion, supple, no masses SKIN: Skin is warm and dry. No rash noted. Not diaphoretic. No erythema. No pallor. [place] Alert and oriented to person, place, and time. Normal reflexes, muscle tone coordination. No cranial nerve deficit noted. PSYCHIATRIC: Normal mood and affect. Normal behavior. Normal judgment and thought content. CARDIOVASCULAR: Normal heart rate noted, regular rhythm RESPIRATORY: Effort and breath sounds normal, no problems with respiration noted, lungs clear to ascultation bilaterally MUSCULOSKELETAL: Normal range of motion. No edema and no tenderness. 2+ distal pulses. ABDOMEN: Soft, nontender, nondistended, gravid. CERVIX:  deferred  Fetal monitoring:  FHT doppler 148 Uterine activity: none  Results for orders placed or performed during the hospital encounter of 06/21/16 (from the past 48 hour(s))  Type and screen [place]     Status: None   Collection Time: 06/21/16  4:57 AM  Result Value Ref Range   ABO/RH(D) O POS    Antibody Screen NEG    Sample Expiration 06/24/2016   CBC     Status: Abnormal   Collection Time: 06/21/16  4:57 AM  Result Value Ref Range   WBC 17.6 (H) 3.6 - 11.0 K/uL   RBC 4.08 3.80 - 5.20 MIL/uL   Hemoglobin 11.2 (L) 12.0 - 16.0 g/dL   HCT 32.5 (L) 35.0 - 47.0 %   MCV 79.7 (L) 80.0 - 100.0 fL   MCH 27.4 26.0 - 34.0 pg   MCHC 34.4 32.0 - 36.0 g/dL   RDW 14.3 11.5 - 14.5 %   Platelets 164 150 - 440 K/uL  Comprehensive metabolic panel     Status: Abnormal   Collection Time: 06/21/16  4:57 AM  Result Value Ref Range   Sodium 137 135 - 145 mmol/L   Potassium 3.6 3.5 - 5.1 mmol/L   Chloride 107 101 - 111 mmol/L   CO2 21 (L) 22 - 32 mmol/L   Glucose, Bld 96 65 - 99 mg/dL   BUN 8 6 - 20 mg/dL   Creatinine, Ser 0.44 0.44 - 1.00 mg/dL   Calcium 8.5 (L) 8.9 - 10.3 mg/dL   Total Protein 6.7 6.5 - 8.1 g/dL   Albumin 3.1 (L) 3.5 - 5.0 g/dL   AST 18 15 - 41 U/L   ALT 10 (L) 14 - 54 U/L   Alkaline Phosphatase 87 38 - 126 U/L   Total Bilirubin 0.4 0.3 - 1.2 mg/dL   GFR calc non Af Amer >60 >60 mL/min   GFR  calc Af Amer >60 >60 mL/min    Comment: (NOTE) The eGFR has been calculated using the CKD EPI equation. This calculation has not been validated in all clinical situations. eGFR's persistently <60 mL/min signify possible Chronic Kidney Disease.    Anion gap 9 5 - 15  Magnesium     Status: None   Collection Time: 06/21/16  4:57 AM  Result Value Ref Range   Magnesium 1.7 1.7 - 2.4 mg/dL  Urinalysis complete, with microscopic ([facility] only)     Status: Abnormal   Collection Time: 06/21/16  5:00 AM  Result Value Ref Range   Color, Urine STRAW (A) YELLOW   APPearance HAZY (A) CLEAR   Glucose, UA NEGATIVE NEGATIVE mg/dL   Bilirubin Urine NEGATIVE NEGATIVE   Ketones, ur NEGATIVE NEGATIVE mg/dL   Specific Gravity, Urine 1.008 1.005 - 1.030   Hgb urine dipstick 2+ (A) NEGATIVE   pH 7.0  5.0 - 8.0   Protein, ur NEGATIVE NEGATIVE mg/dL   Nitrite NEGATIVE NEGATIVE   Leukocytes, UA 2+ (A) NEGATIVE   RBC / HPF 6-30 0 - 5 RBC/hpf   WBC, UA TOO NUMEROUS TO COUNT 0 - 5 WBC/hpf   Bacteria, UA RARE (A) NONE SEEN   Squamous Epithelial / LPF 0-5 (A) NONE SEEN   Mucous PRESENT   Gastrointestinal Panel by PCR , Stool     Status: None   Collection Time: 06/21/16  2:45 PM  Result Value Ref Range   Campylobacter species NOT DETECTED NOT DETECTED   Plesimonas shigelloides NOT DETECTED NOT DETECTED   Salmonella species NOT DETECTED NOT DETECTED   Yersinia enterocolitica NOT DETECTED NOT DETECTED   Vibrio species NOT DETECTED NOT DETECTED   Vibrio cholerae NOT DETECTED NOT DETECTED   Enteroaggregative E coli (EAEC) NOT DETECTED NOT DETECTED   Enteropathogenic E coli (EPEC) NOT DETECTED NOT DETECTED   Enterotoxigenic E coli (ETEC) NOT DETECTED NOT DETECTED   Shiga like toxin producing E coli (STEC) NOT DETECTED NOT DETECTED   E. coli O157 NOT DETECTED NOT DETECTED   Shigella/Enteroinvasive E coli (EIEC) NOT DETECTED NOT DETECTED   Cryptosporidium NOT DETECTED NOT DETECTED   Cyclospora cayetanensis NOT DETECTED NOT DETECTED   Entamoeba histolytica NOT DETECTED NOT DETECTED   Giardia lamblia NOT DETECTED NOT DETECTED   Adenovirus F40/41 NOT DETECTED NOT DETECTED   Astrovirus NOT DETECTED NOT DETECTED   Norovirus GI/GII NOT DETECTED NOT DETECTED   Rotavirus A NOT DETECTED NOT DETECTED   Sapovirus (I, II, IV, and V) NOT DETECTED NOT DETECTED  C difficile quick scan w PCR reflex     Status: None   Collection Time: 06/21/16  2:45 PM  Result Value Ref Range   C Diff antigen NEGATIVE NEGATIVE   C Diff toxin NEGATIVE NEGATIVE   C Diff interpretation No C. difficile detected.   CBC     Status: Abnormal   Collection Time: 06/22/16 10:32 AM  Result Value Ref Range   WBC 12.1 (H) 3.6 - 11.0 K/uL   RBC 3.60 (L) 3.80 - 5.20 MIL/uL   Hemoglobin 9.7 (L) 12.0 - 16.0 g/dL   HCT 28.7 (L) 35.0 -  47.0 %   MCV 79.5 (L) 80.0 - 100.0 fL   MCH 27.0 26.0 - 34.0 pg   MCHC 34.0 32.0 - 36.0 g/dL   RDW 14.3 11.5 - 14.5 %   Platelets 154 150 - 440 K/uL    Mr Abdomen Limited  06/21/2016  CLINICAL DATA:  Pregnant with right-sided  abdominal pain for 2 weeks. Nausea, vomiting and diarrhea. EXAM: MRI ABDOMEN AND PELVIS WITHOUT CONTRAST TECHNIQUE: Multiplanar multisequence MR imaging of the abdomen and pelvis was performed. No intravenous contrast was administered. COMPARISON:  None. FINDINGS: MRI ABDOMEN FINDINGS The solid abdominal organs are normal. The gallbladder is normal. Normal caliber and course of the common bile duct. There is mild expected right-sided hydroureteronephrosis and a duplicated right renal collecting system. No free fluid is identified. MRI PELVIS FINDINGS Gravid uterus. Patient is [redacted] weeks pregnant. The fetus appears normal. Cephalic presentation. Normal amniotic fluid volume. Normal appearing placenta. No gross abnormalities are identified. The small bowel and colon are grossly normal. I believe I can identify any terminal ileum and appendix and may appear normal. No findings for acute appendicitis. IMPRESSION: 1. No MR findings to suggest acute appendicitis. 2. Normal appearance of the gallbladder and pancreaticobiliary tree. 3. 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 4. Normal appearance of the uterus and fetus. Electronically Signed   By: Marijo Sanes M.D.   On: 06/21/2016 10:22    Current scheduled medications . cefTRIAXone (ROCEPHIN)  IV  1 g Intravenous Q24H  . metroNIDAZOLE  500 mg Oral Q8H    I have reviewed the patient's current medications.  ASSESSMENT: 25yo [id number] @ 30.5 with presumed colitis  Patient Active Problem List   Diagnosis Date Noted  . Right lower quadrant abdominal pain 06/21/2016  . Diarrhea 06/21/2016  . Nausea 05/10/2016    PLAN: 1. Was on Rocephin for urinary symptoms/pain? With new  hematuria, continue this for now until a culture returns.  Flagyl added yesterday for colitis picture although all tested stool has returned negative.  So far with improved symptoms, will continue.  Consider probiotic supplementation.  D/c enteric precautions. 2. Headache: not a routine caffeine user, so likely not withdrawal.  Will give Fioricet and see if this helps.  If not will try compazine/benadryl combo.  Lastly could try imitrex or other triptan. 3. Continue routine antenatal care.   ----- Larey Days, MD Attending Obstetrician and Gynecologist [facility]

## 2016-06-23 DIAGNOSIS — O2343 Unspecified infection of urinary tract in pregnancy, third trimester: Secondary | ICD-10-CM

## 2016-06-23 DIAGNOSIS — O26893 Other specified pregnancy related conditions, third trimester: Secondary | ICD-10-CM | POA: Diagnosis not present

## 2016-06-23 LAB — URINE CULTURE

## 2016-06-23 MED ORDER — PRENATAL VITAMIN 27-0.8 MG PO TABS: 1.0000 | ORAL_TABLET | Freq: Every day | ORAL | Status: DC

## 2016-06-23 NOTE — Procedures (Signed)
NST for decreased fetal movement  Time 20 mins FHT: 125  Variability: moderate Accelerations:  Present, >2 Decelerations: absent  Toco:  Mild contractions q314min  Interpretation: Reactive NST, Category 1 tracing  Fetal movement still decreased but above minimum; patient reassured.  ----- Ranae Plumberhelsea Ward, MD Attending Obstetrician and Gynecologist [facility]

## 2016-06-23 NOTE — Discharge Summary (Signed)
Physician Discharge Summary  Patient ID: Mary Suarez MRN: [id number] DOB/AGE: [birth year] 25 y.o.  Admit date: 06/21/2016 Discharge date: 06/23/2016  Admission Diagnoses: Abdominal pain  Discharge Diagnoses:  Principal Problem:   Abdominal pain resolving Active Problems:   Diarrhea improving, nausea   Urinary tract infection affecting care of mother in third trimester, antepartum  Discharged Condition: good Pt states she is feeling better. Her last episode of diarrhea was last night. She is still experiencing some nausea and did not tolerate the eggs she ate for breakfast, but she did eat yogurt without difficulty. She feels well enough to go home where she will continue resting.  Hospital Course: admitted for abdominal pain, diarrhea, uti, IV fluids, antibiotics, fetal monitoring  Consults: None  Significant Diagnostic Studies: labs:   Results for Mary, Suarez (MRN [id number]) as of 06/23/2016 11:17  Ref. Range 06/21/2016 04:57 06/21/2016 05:00 06/21/2016 10:41 06/21/2016 14:45 06/22/2016 10:32  Sodium Latest Ref Range: 135-145 mmol/L 137      Potassium Latest Ref Range: 3.5-5.1 mmol/L 3.6      Chloride Latest Ref Range: 101-111 mmol/L 107      CO2 Latest Ref Range: 22-32 mmol/L 21 (L)      BUN Latest Ref Range: 6-20 mg/dL 8      Creatinine Latest Ref Range: 0.44-1.00 mg/dL 0.44      Calcium Latest Ref Range: 8.9-10.3 mg/dL 8.5 (L)      EGFR (Non-African Amer.) Latest Ref Range: >60 mL/min >60      EGFR (African American) Latest Ref Range: >60 mL/min >60      Glucose Latest Ref Range: 65-99 mg/dL 96      Anion gap Latest Ref Range: 5-15  9      Magnesium Latest Ref Range: 1.7-2.4 mg/dL 1.7      Alkaline Phosphatase Latest Ref Range: 38-126 U/L 87      Albumin Latest Ref Range: 3.5-5.0 g/dL 3.1 (L)      AST Latest Ref Range: 15-41 U/L 18      ALT Latest Ref Range: 14-54 U/L 10 (L)      Total Protein Latest Ref Range: 6.5-8.1 g/dL 6.7      Total Bilirubin Latest Ref Range:  0.3-1.2 mg/dL 0.4      WBC Latest Ref Range: 3.6-11.0 K/uL 17.6 (H)    12.1 (H)  RBC Latest Ref Range: 3.80-5.20 MIL/uL 4.08    3.60 (L)  Hemoglobin Latest Ref Range: 12.0-16.0 g/dL 11.2 (L)    9.7 (L)  HCT Latest Ref Range: 35.0-47.0 % 32.5 (L)    28.7 (L)  MCV Latest Ref Range: 80.0-100.0 fL 79.7 (L)    79.5 (L)  MCH Latest Ref Range: 26.0-34.0 pg 27.4    27.0  MCHC Latest Ref Range: 32.0-36.0 g/dL 34.4    34.0  RDW Latest Ref Range: 11.5-14.5 % 14.3    14.3  Platelets Latest Ref Range: 150-440 K/uL 164    154  Sample Expiration Unknown 06/24/2016      Antibody Screen Unknown NEG      ABO/RH(D) Unknown O POS      Appearance Latest Ref Range: CLEAR   HAZY (A)     Bacteria, UA Latest Ref Range: NONE SEEN   RARE (A)     Bilirubin Urine Latest Ref Range: NEGATIVE   NEGATIVE     Color, Urine Latest Ref Range: YELLOW   STRAW (A)     Glucose Latest Ref Range: NEGATIVE mg/dL  NEGATIVE  Hgb urine dipstick Latest Ref Range: NEGATIVE   2+ (A)     Ketones, ur Latest Ref Range: NEGATIVE mg/dL  NEGATIVE     Leukocytes, UA Latest Ref Range: NEGATIVE   2+ (A)     Mucous Unknown  PRESENT     Nitrite Latest Ref Range: NEGATIVE   NEGATIVE     pH Latest Ref Range: 5.0-8.0   7.0     Protein Latest Ref Range: NEGATIVE mg/dL  NEGATIVE     RBC / HPF Latest Ref Range: 0-5 RBC/hpf  6-30     Specific Gravity, Urine Latest Ref Range: 1.005-1.030   1.008     Squamous Epithelial / LPF Latest Ref Range: NONE SEEN   0-5 (A)     WBC, UA Latest Ref Range: 0-5 WBC/hpf  TOO NUMEROUS TO C...     C Diff interpretation Unknown    No C. difficile d...   C Diff toxin Latest Ref Range: NEGATIVE     NEGATIVE   C Diff antigen Latest Ref Range: NEGATIVE     NEGATIVE   GASTROINTESTINAL PANEL BY PCR, STOOL (REPLACES STOOL CULTURE) Unknown    Rpt   URINE CULTURE Unknown  Rpt (A)     MR ABDOMEN LIMITED Unknown   Rpt      Treatments: IV hydration and antibiotics: Rocephin  Discharge Exam: Blood pressure 105/69, pulse 69,  temperature 97.8 F (36.6 C), temperature source Oral, resp. rate 18, height '5\' 6"'$  (1.676 m), weight 78.926 kg (174 lb), last menstrual period 11/20/2015, SpO2 100 %. General appearance: alert, cooperative, appears stated age and no distress Resp: clear to auscultation bilaterally Cardio: regular rate and rhythm, S1, S2 normal, no murmur, click, rub or gallop GI: gravid, soft, non-tender; bowel sounds normal; no masses,  no organomegaly  Disposition: 01-Home or Self Care      Discharge Instructions    Discharge activity:  No Restrictions    Complete by:  As directed      Discharge diet:  Bland diet and advance as tolerated, hydrate with H2O  Complete by:  As directed      Fetal Kick Count:  Lie on our left side for one hour after a meal, and count the number of times your baby kicks.  If it is less than 5 times, get up, move around and drink some juice.  Repeat the test 30 minutes later.  If it is still less than 5 kicks in an hour, notify your doctor.    Complete by:  As directed      No sexual activity restrictions    Complete by:  As directed      Notify physician for a general feeling that "something is not right"    Complete by:  As directed      Notify physician for increase or change in vaginal discharge    Complete by:  As directed      Notify physician for intestinal cramps, with or without diarrhea, sometimes described as "gas pain"    Complete by:  As directed      Notify physician for leaking of fluid    Complete by:  As directed      Notify physician for low, dull backache, unrelieved by heat or Tylenol    Complete by:  As directed      Notify physician for menstrual like cramps    Complete by:  As directed      Notify physician for pelvic pressure  Complete by:  As directed      Notify physician for uterine contractions.  These may be painless and feel like the uterus is tightening or the baby is  "balling up"    Complete by:  As directed      Notify physician for  vaginal bleeding    Complete by:  As directed      PRETERM LABOR:  Includes any of the follwing symptoms that occur between 20 - [redacted] weeks gestation.  If these symptoms are not stopped, preterm labor can result in preterm delivery, placing your baby at risk    Complete by:  As directed              Medication List    STOP taking these medications        loperamide 1 MG/5ML solution  Commonly known as:  IMODIUM     nitrofurantoin 100 MG capsule  Commonly known as:  MACRODANTIN     norethindrone-ethinyl estradiol-iron 1-20/1-30/1-35 MG-MCG tablet  Commonly known as:  ESTROSTEP FE,TILIA FE,TRI-LEGEST FE     ondansetron 4 MG tablet  Commonly known as:  ZOFRAN     ranitidine 150 MG tablet  Commonly known as:  ZANTAC      TAKE these medications        Prenatal Vitamin 27-0.8 MG Tabs  Take 1 tablet by mouth daily.      Ampicillin 500 mg q 6hrs for 7 days  Follow-up Information    Go to to follow up.   Why:  regular schedules prenatal appointment      Signed: Rod Can, CNM

## 2016-06-23 NOTE — Progress Notes (Signed)
D/C order from Tresea MallJane Gledhill CNM.  Reviewed d/c instructions and prescriptions with patient and answered any questions.  Patient d/c home via wheelchair by auxillary

## 2016-07-11 ENCOUNTER — Other Ambulatory Visit
Admission: RE | Admit: 2016-07-11 | Discharge: 2016-07-11 | Disposition: A | Discharge status: Home / Self Care | Payer: Medicaid Other | Source: Ambulatory Visit | Attending: Gastroenterology | Admitting: Gastroenterology

## 2016-07-11 DIAGNOSIS — Z029 Encounter for administrative examinations, unspecified: Secondary | ICD-10-CM | POA: Diagnosis present

## 2016-07-18 ENCOUNTER — Encounter: Payer: Self-pay | Admitting: Gastroenterology

## 2016-07-18 LAB — MISCELLANEOUS TEST

## 2016-07-29 ENCOUNTER — Observation Stay
Admission: EM | Admit: 2016-07-29 | Discharge: 2016-07-29 | Disposition: A | Discharge status: Home / Self Care | Payer: Medicaid Other | Attending: Obstetrics & Gynecology | Admitting: Obstetrics & Gynecology

## 2016-07-29 DIAGNOSIS — Z3A36 36 weeks gestation of pregnancy: Secondary | ICD-10-CM | POA: Diagnosis not present

## 2016-07-29 DIAGNOSIS — O26899 Other specified pregnancy related conditions, unspecified trimester: Secondary | ICD-10-CM

## 2016-07-29 DIAGNOSIS — R109 Unspecified abdominal pain: Secondary | ICD-10-CM

## 2016-07-29 HISTORY — DX: Anemia, unspecified: D64.9

## 2016-07-29 MED ORDER — LACTATED RINGERS IV SOLN
INTRAVENOUS | Status: DC
  Administered 2016-07-29: 02:00:00 via INTRAVENOUS

## 2016-07-29 MED ORDER — LACTATED RINGERS IV BOLUS (SEPSIS)
500.0000 mL | Freq: Once | INTRAVENOUS | Status: AC
  Administered 2016-07-29: 1000 mL via INTRAVENOUS

## 2016-07-29 MED ORDER — MORPHINE SULFATE (PF) 2 MG/ML IV SOLN: 2.0000 mg | INTRAVENOUS | Status: DC | PRN

## 2016-07-29 NOTE — H&P (Signed)
Obstetric History and Physical  Mary GrapesCasey Amanda Suarez is a 25 y.o. [id number] with Estimated Date of Delivery: 08/26/16  who presents at 3698w0d  presenting for leaking fluid and contractions. Patient states she had large gush of fluid on bed and running down her leg. Reports contractions started after this. Denies vaginal bleeding with active fetal movement.    Prenatal Course Source of Care: [facility]  Pregnancy complications or risks: None per pt, records not available today Patient Active Problem List   Diagnosis Date Noted  . Abdominal pain affecting pregnancy 07/29/2016  . Urinary tract infection affecting care of mother in third trimester, antepartum 06/23/2016  . Right lower quadrant abdominal pain 06/21/2016  . Diarrhea 06/21/2016  . Nausea 05/10/2016      Prenatal Transfer Tool   Past Medical History:  Diagnosis Date  . Anemia   . Morning sickness     Past Surgical History:  Procedure Laterality Date  . CESAREAN SECTION      OB History  Gravida Para Term Preterm AB Living  3 2 2     2   SAB TAB Ectopic Multiple Live Births          2    # Outcome Date GA Lbr Len/2nd Weight Sex Delivery Anes PTL Lv  3 Current           2 Term 06/30/13    F CS-LTranv   LIV  1 Term 09/09/11    F CS-LTranv   LIV      Social History   Social History  . Marital status: Single    Spouse name: N/A  . Number of children: N/A  . Years of education: N/A   Social History Main Topics  . Smoking status: Never Smoker  . Smokeless tobacco: Never Used  . Alcohol use No  . Drug use: No  . Sexual activity: Not Asked   Other Topics Concern  . None   Social History Narrative  . None    No family history on file.  Prescriptions Prior to Admission  Medication Sig Dispense Refill Last Dose  . Prenatal Vit-Fe Fumarate-FA (PRENATAL VITAMIN) 27-0.8 MG TABS Take 1 tablet by mouth daily. 30 tablet 3     Allergies  Allergen Reactions  . Phenergan [Promethazine Hcl] Other (See Comments)   Seizures    Review of Systems: Negative except for what is mentioned in HPI.  Physical Exam: BP 129/73   Pulse 96   Temp 98.9 F (37.2 C) (Oral)   Resp 17   Ht 5\' 6"  (1.676 m)   Wt 183 lb (83 kg)   LMP 11/20/2015 (Approximate)   BMI 29.54 kg/m  GENERAL: Well-developed, well-nourished female in no acute distress.  LUNGS: Non-labored breathing ABDOMEN: Soft, nontender, nondistended, gravid. EXTREMITIES: Nontender, no edema Cervical Exam: Dilatation 0cm   Effacement 0%   Station -2   Speculum exam: neg pooling, nitrizine and ferning Presentation: cephalic FHT: Category: 1 Baseline rate 130 bpm   Variability moderate  Accelerations present   Decelerations none Contractions: Every 2-3 mins   Pertinent Labs/Studies:   No results found for this or any previous visit (from the past 24 hour(s)).  Assessment : IUP at 4598w0d, intact membranes, preterm contractions  Plan: Observe for cervical change  IV fluid bolus, IV pain meds prn and terbutaline if contractions continue.

## 2016-07-29 NOTE — OB Triage Note (Signed)
Patient presented to L&D complaining of leaking fluid.  Patient states she woke up with a large wet area in the bed and continued to have a gush of fluid after she got up.  Denies any vaginal bleeding or decreased fetal movement. States she has had intercourse recently.

## 2016-07-29 NOTE — Progress Notes (Signed)
Discharge instructions reviewed with patient who verbalized understanding.  Patient discharged home in stable ambulatory condition.

## 2016-07-29 NOTE — ED Notes (Signed)
Pt to obs 2; Dr Tiburcio PeaHarris

## 2016-07-29 NOTE — ED Notes (Signed)
Pt to obs 3; Dr Valentino Saxonherry

## 2016-07-29 NOTE — Discharge Summary (Signed)
S: contractions unchanged after IV fluid bolus. Pt declined pain medication or tocolytics.   O: Cervix closed, VSS, FHR: cat 1 tracing, toco: q 3-4 min, mild  A: IUP at 6973w0d, no evidence of preterm labor or PROM  P: Reviewed options of 1. Continued monitoring, 2. Procardia/terbutaline/IV pain medication 3. Discharge home.  Pt reports contractions are now more mild and opts to be discharged home.  [facility]

## 2016-08-20 ENCOUNTER — Encounter
Admission: RE | Admit: 2016-08-20 | Discharge: 2016-08-20 | Disposition: A | Discharge status: Home / Self Care | Payer: Medicaid Other | Source: Ambulatory Visit | Attending: Obstetrics and Gynecology | Admitting: Obstetrics and Gynecology

## 2016-08-20 HISTORY — DX: Type 2 diabetes mellitus without complications: E11.9

## 2016-08-20 HISTORY — DX: Headache: R51

## 2016-08-20 HISTORY — DX: Unspecified asthma, uncomplicated: J45.909

## 2016-08-20 HISTORY — DX: Headache, unspecified: R51.9

## 2016-08-20 LAB — DIFFERENTIAL
BASOS PCT: 0 %
Basophils Absolute: 0 10*3/uL (ref 0–0.1)
EOS PCT: 1 %
Eosinophils Absolute: 0.1 10*3/uL (ref 0–0.7)
Lymphocytes Relative: 16 %
Lymphs Abs: 2 10*3/uL (ref 1.0–3.6)
MONO ABS: 0.9 10*3/uL (ref 0.2–0.9)
Monocytes Relative: 7 %
Neutro Abs: 9.6 10*3/uL — ABNORMAL HIGH (ref 1.4–6.5)
Neutrophils Relative %: 76 %

## 2016-08-20 LAB — TYPE AND SCREEN
ABO/RH(D): O POS
Antibody Screen: NEGATIVE
Extend sample reason: UNDETERMINED

## 2016-08-20 LAB — CBC
HCT: 32.1 % — ABNORMAL LOW (ref 35.0–47.0)
Hemoglobin: 10.8 g/dL — ABNORMAL LOW (ref 12.0–16.0)
MCH: 25.3 pg — AB (ref 26.0–34.0)
MCHC: 33.6 g/dL (ref 32.0–36.0)
MCV: 75.3 fL — AB (ref 80.0–100.0)
PLATELETS: 173 10*3/uL (ref 150–440)
RBC: 4.26 MIL/uL (ref 3.80–5.20)
RDW: 17 % — AB (ref 11.5–14.5)
WBC: 12.5 10*3/uL — AB (ref 3.6–11.0)

## 2016-08-20 LAB — RAPID HIV SCREEN (HIV 1/2 AB+AG)
HIV 1/2 Antibodies: NONREACTIVE
HIV-1 P24 ANTIGEN - HIV24: NONREACTIVE

## 2016-08-20 MED ORDER — CEFAZOLIN SODIUM-DEXTROSE 2-4 GM/100ML-% IV SOLN
2.0000 g | INTRAVENOUS | Status: AC
  Administered 2016-08-21: 2 g via INTRAVENOUS
  Filled 2016-08-20: qty 100

## 2016-08-20 NOTE — Patient Instructions (Signed)
  Your procedure is scheduled on:August 21, 2016 (Thursday) Report to EMERGENCY DEPARTMENT To find out your arrival time please call [phone] between 1PM - 3PM on ARRIVAL TIME 5:30 AM  Remember: Instructions that are not followed completely may result in serious medical risk, up to and including death, or upon the discretion of your surgeon and anesthesiologist your surgery may need to be rescheduled.    _x___ 1. Do not eat food or drink liquids after midnight. No gum chewing or hard candies.     _x___ 2. No Alcohol for 24 hours before or after surgery.   x__3. No Smoking for 24 prior to surgery.   ____  4. Bring all medications with you on the day of surgery if instructed.    __x__ 5. Notify your doctor if there is any change in your medical condition     (cold, fever, infections).     Do not wear jewelry, make-up, hairpins, clips or nail polish.  Do not wear lotions, powders, or perfumes. You may wear deodorant.  Do not shave 48 hours prior to surgery. Men may shave face and neck.  Do not bring valuables to the hospital.    [facility] is not responsible for any belongings or valuables.               Contacts, dentures or bridgework may not be worn into surgery.  Leave your suitcase in the car. After surgery it may be brought to your room.  For patients admitted to the hospital, discharge time is determined by your treatment team.   Patients discharged the day of surgery will not be allowed to drive home.    Please read over the following fact sheets that you were given:   [facility] Preparing for Surgery and or MRSA Information   ____ Take these medicines the morning of surgery with A SIP OF WATER:    1.   2.  3.  4.  5.  6.  ____ Fleet Enema (as directed)   _x___ Use CHG Soap or sage wipes as directed on instruction sheet   ____ Use inhalers on the day of surgery and bring to hospital day of surgery  ____ Stop metformin 2 days prior to surgery    ____  Take 1/2 of usual insulin dose the night before surgery and none on the morning of surgery.            _x_ Stop aspirin or coumadin, or plavix (NO ASPIRIN)  _x__ Stop Anti-inflammatories such as Advil, Aleve, Ibuprofen, Motrin, Naproxen,          Naprosyn, Goodies powders or aspirin products. Ok to take Tylenol.   ____ Stop supplements until after surgery.    ____ Bring C-Pap to the hospital.

## 2016-08-21 ENCOUNTER — Inpatient Hospital Stay: Payer: Medicaid Other | Admitting: Anesthesiology

## 2016-08-21 ENCOUNTER — Encounter
Admission: RE | Disposition: A | Discharge status: Home / Self Care | Payer: Self-pay | Source: Ambulatory Visit | Attending: Obstetrics and Gynecology

## 2016-08-21 ENCOUNTER — Inpatient Hospital Stay
Admission: RE | Admit: 2016-08-21 | Discharge: 2016-08-24 | DRG: 765 | Disposition: A | Discharge status: Home / Self Care | Payer: Medicaid Other | Source: Ambulatory Visit | Attending: Obstetrics and Gynecology | Admitting: Obstetrics and Gynecology

## 2016-08-21 DIAGNOSIS — O34211 Maternal care for low transverse scar from previous cesarean delivery: Secondary | ICD-10-CM | POA: Diagnosis present

## 2016-08-21 DIAGNOSIS — O34219 Maternal care for unspecified type scar from previous cesarean delivery: Secondary | ICD-10-CM | POA: Diagnosis present

## 2016-08-21 DIAGNOSIS — R1031 Right lower quadrant pain: Secondary | ICD-10-CM

## 2016-08-21 DIAGNOSIS — Z3A39 39 weeks gestation of pregnancy: Secondary | ICD-10-CM | POA: Diagnosis not present

## 2016-08-21 DIAGNOSIS — N736 Female pelvic peritoneal adhesions (postinfective): Secondary | ICD-10-CM | POA: Diagnosis present

## 2016-08-21 DIAGNOSIS — D62 Acute posthemorrhagic anemia: Secondary | ICD-10-CM | POA: Diagnosis present

## 2016-08-21 DIAGNOSIS — O9902 Anemia complicating childbirth: Secondary | ICD-10-CM | POA: Diagnosis present

## 2016-08-21 DIAGNOSIS — Z98891 History of uterine scar from previous surgery: Secondary | ICD-10-CM

## 2016-08-21 DIAGNOSIS — O09893 Supervision of other high risk pregnancies, third trimester: Secondary | ICD-10-CM

## 2016-08-21 LAB — RPR: RPR: NONREACTIVE

## 2016-08-21 SURGERY — Surgical Case
Anesthesia: Spinal | Wound class: Clean Contaminated

## 2016-08-21 MED ORDER — NALBUPHINE HCL 10 MG/ML IJ SOLN: 5.0000 mg | Freq: Once | INTRAMUSCULAR | Status: DC | PRN

## 2016-08-21 MED ORDER — MENTHOL 3 MG MT LOZG
1.0000 | LOZENGE | OROMUCOSAL | Status: DC | PRN
  Filled 2016-08-21: qty 9

## 2016-08-21 MED ORDER — SOD CITRATE-CITRIC ACID 500-334 MG/5ML PO SOLN
30.0000 mL | ORAL | Status: AC
  Administered 2016-08-21: 30 mL via ORAL
  Filled 2016-08-21: qty 15

## 2016-08-21 MED ORDER — FERROUS SULFATE 325 (65 FE) MG PO TABS
325.0000 mg | ORAL_TABLET | Freq: Two times a day (BID) | ORAL | Status: DC
  Administered 2016-08-21 – 2016-08-24 (×6): 325 mg via ORAL
  Filled 2016-08-21 (×6): qty 1

## 2016-08-21 MED ORDER — SODIUM CHLORIDE 0.9% FLUSH: 3.0000 mL | INTRAVENOUS | Status: DC | PRN

## 2016-08-21 MED ORDER — OXYCODONE HCL 5 MG PO TABS
5.0000 mg | ORAL_TABLET | ORAL | Status: DC | PRN
  Administered 2016-08-21: 10 mg via ORAL
  Administered 2016-08-21: 5 mg via ORAL
  Administered 2016-08-22 (×2): 10 mg via ORAL
  Filled 2016-08-21: qty 2
  Filled 2016-08-21: qty 1
  Filled 2016-08-21 (×3): qty 2

## 2016-08-21 MED ORDER — LACTATED RINGERS IV SOLN
INTRAVENOUS | Status: DC
  Administered 2016-08-21: 07:00:00 via INTRAVENOUS

## 2016-08-21 MED ORDER — OXYCODONE-ACETAMINOPHEN 5-325 MG PO TABS
2.0000 | ORAL_TABLET | ORAL | Status: DC | PRN
  Administered 2016-08-22 – 2016-08-23 (×8): 2 via ORAL
  Filled 2016-08-21 (×8): qty 2

## 2016-08-21 MED ORDER — OXYTOCIN 40 UNITS IN LACTATED RINGERS INFUSION - SIMPLE MED: 2.5000 [IU]/h | INTRAVENOUS | Status: AC

## 2016-08-21 MED ORDER — PHENYLEPHRINE HCL 10 MG/ML IJ SOLN
INTRAMUSCULAR | Status: DC | PRN
  Administered 2016-08-21 (×2): 100 ug via INTRAVENOUS

## 2016-08-21 MED ORDER — HYDROMORPHONE HCL 1 MG/ML IJ SOLN
INTRAMUSCULAR | Status: DC | PRN
  Administered 2016-08-21: .05 mg via SUBCUTANEOUS

## 2016-08-21 MED ORDER — BUPIVACAINE IN DEXTROSE 0.75-8.25 % IT SOLN
INTRATHECAL | Status: DC | PRN
  Administered 2016-08-21: 1.7 mL via INTRATHECAL

## 2016-08-21 MED ORDER — BUPIVACAINE 0.25 % ON-Q PUMP DUAL CATH 400 ML
400.0000 mL | INJECTION | Status: DC
  Filled 2016-08-21: qty 400

## 2016-08-21 MED ORDER — PRENATAL MULTIVITAMIN CH
1.0000 | ORAL_TABLET | Freq: Every day | ORAL | Status: DC
  Administered 2016-08-22 – 2016-08-24 (×3): 1 via ORAL
  Filled 2016-08-21 (×3): qty 1

## 2016-08-21 MED ORDER — ONDANSETRON HCL 4 MG/2ML IJ SOLN
4.0000 mg | Freq: Once | INTRAMUSCULAR | Status: AC | PRN
  Administered 2016-08-21: 4 mg via INTRAVENOUS

## 2016-08-21 MED ORDER — METOCLOPRAMIDE HCL 5 MG/ML IJ SOLN
10.0000 mg | Freq: Once | INTRAMUSCULAR | Status: AC
  Administered 2016-08-21: 10 mg via INTRAVENOUS
  Filled 2016-08-21: qty 2

## 2016-08-21 MED ORDER — BUPIVACAINE ON-Q PAIN PUMP (FOR ORDER SET NO CHG): INJECTION | Status: DC

## 2016-08-21 MED ORDER — BUPIVACAINE HCL (PF) 0.5 % IJ SOLN: 5.0000 mL | Freq: Once | INTRAMUSCULAR | Status: DC

## 2016-08-21 MED ORDER — COCONUT OIL OIL: 1.0000 "application " | TOPICAL_OIL | Status: DC | PRN

## 2016-08-21 MED ORDER — SENNOSIDES-DOCUSATE SODIUM 8.6-50 MG PO TABS
2.0000 | ORAL_TABLET | ORAL | Status: DC
  Administered 2016-08-21 – 2016-08-23 (×3): 2 via ORAL
  Filled 2016-08-21 (×3): qty 2

## 2016-08-21 MED ORDER — NALOXONE HCL 0.4 MG/ML IJ SOLN: 0.4000 mg | INTRAMUSCULAR | Status: DC | PRN

## 2016-08-21 MED ORDER — DIPHENHYDRAMINE HCL 25 MG PO CAPS: 25.0000 mg | ORAL_CAPSULE | Freq: Four times a day (QID) | ORAL | Status: DC | PRN

## 2016-08-21 MED ORDER — NALOXONE HCL 2 MG/2ML IJ SOSY
1.0000 ug/kg/h | PREFILLED_SYRINGE | INTRAVENOUS | Status: DC | PRN
  Filled 2016-08-21: qty 2

## 2016-08-21 MED ORDER — LACTATED RINGERS IV SOLN
INTRAVENOUS | Status: DC
  Administered 2016-08-21: 13:00:00 via INTRAVENOUS

## 2016-08-21 MED ORDER — ONDANSETRON HCL 4 MG/2ML IJ SOLN
4.0000 mg | Freq: Three times a day (TID) | INTRAMUSCULAR | Status: DC | PRN
  Administered 2016-08-21: 4 mg via INTRAVENOUS
  Filled 2016-08-21: qty 2

## 2016-08-21 MED ORDER — IBUPROFEN 600 MG PO TABS
600.0000 mg | ORAL_TABLET | Freq: Four times a day (QID) | ORAL | Status: DC
  Administered 2016-08-22 – 2016-08-24 (×9): 600 mg via ORAL
  Filled 2016-08-21 (×9): qty 1

## 2016-08-21 MED ORDER — FENTANYL CITRATE (PF) 100 MCG/2ML IJ SOLN
INTRAMUSCULAR | Status: DC | PRN
  Administered 2016-08-21: 15 ug via INTRATHECAL

## 2016-08-21 MED ORDER — SCOPOLAMINE 1 MG/3DAYS TD PT72
1.0000 | MEDICATED_PATCH | TRANSDERMAL | Status: DC
  Administered 2016-08-21: 1.5 mg via TRANSDERMAL
  Filled 2016-08-21: qty 1

## 2016-08-21 MED ORDER — ONDANSETRON HCL 4 MG/2ML IJ SOLN
INTRAMUSCULAR | Status: AC
  Administered 2016-08-21: 4 mg via INTRAVENOUS
  Filled 2016-08-21: qty 2

## 2016-08-21 MED ORDER — SIMETHICONE 80 MG PO CHEW
80.0000 mg | CHEWABLE_TABLET | Freq: Three times a day (TID) | ORAL | Status: DC
Start: 2016-08-21 — End: 2016-08-24
  Administered 2016-08-21 – 2016-08-24 (×9): 80 mg via ORAL
  Filled 2016-08-21 (×9): qty 1

## 2016-08-21 MED ORDER — DEXAMETHASONE SODIUM PHOSPHATE 10 MG/ML IJ SOLN
10.0000 mg | Freq: Once | INTRAMUSCULAR | Status: AC
  Administered 2016-08-21: 10 mg via INTRAVENOUS
  Filled 2016-08-21: qty 1

## 2016-08-21 MED ORDER — WITCH HAZEL-GLYCERIN EX PADS: 1.0000 "application " | MEDICATED_PAD | CUTANEOUS | Status: DC | PRN

## 2016-08-21 MED ORDER — NALBUPHINE HCL 10 MG/ML IJ SOLN: 5.0000 mg | INTRAMUSCULAR | Status: DC | PRN

## 2016-08-21 MED ORDER — DIBUCAINE 1 % RE OINT: 1.0000 "application " | TOPICAL_OINTMENT | RECTAL | Status: DC | PRN

## 2016-08-21 MED ORDER — BUPIVACAINE HCL (PF) 0.5 % IJ SOLN
5.0000 mL | Freq: Once | INTRAMUSCULAR | Status: DC
  Filled 2016-08-21: qty 30

## 2016-08-21 MED ORDER — LACTATED RINGERS IV SOLN
Freq: Once | INTRAVENOUS | Status: AC
  Administered 2016-08-21: 06:00:00 via INTRAVENOUS

## 2016-08-21 MED ORDER — KETOROLAC TROMETHAMINE 30 MG/ML IJ SOLN: 30.0000 mg | Freq: Four times a day (QID) | INTRAMUSCULAR | Status: AC | PRN

## 2016-08-21 MED ORDER — KETOROLAC TROMETHAMINE 30 MG/ML IJ SOLN
30.0000 mg | Freq: Four times a day (QID) | INTRAMUSCULAR | Status: AC | PRN
  Administered 2016-08-21 (×3): 30 mg via INTRAVENOUS
  Filled 2016-08-21 (×3): qty 1

## 2016-08-21 MED ORDER — FENTANYL CITRATE (PF) 100 MCG/2ML IJ SOLN: 25.0000 ug | INTRAMUSCULAR | Status: DC | PRN

## 2016-08-21 MED ORDER — ONDANSETRON HCL 4 MG/2ML IJ SOLN
INTRAMUSCULAR | Status: DC | PRN
  Administered 2016-08-21: 8 mg via INTRAVENOUS

## 2016-08-21 MED ORDER — BUPIVACAINE HCL 0.5 % IJ SOLN
INTRAMUSCULAR | Status: DC | PRN
  Administered 2016-08-21: 10 mL

## 2016-08-21 MED ORDER — DIPHENHYDRAMINE HCL 25 MG PO CAPS: 25.0000 mg | ORAL_CAPSULE | ORAL | Status: DC | PRN

## 2016-08-21 MED ORDER — DIPHENHYDRAMINE HCL 50 MG/ML IJ SOLN: 12.5000 mg | INTRAMUSCULAR | Status: DC | PRN

## 2016-08-21 MED ORDER — EPHEDRINE SULFATE-NACL 50-0.9 MG/10ML-% IV SOSY
PREFILLED_SYRINGE | INTRAVENOUS | Status: DC | PRN
  Administered 2016-08-21 (×3): 5 mg via INTRAVENOUS

## 2016-08-21 MED ORDER — OXYTOCIN 40 UNITS IN LACTATED RINGERS INFUSION - SIMPLE MED
INTRAVENOUS | Status: DC | PRN
  Administered 2016-08-21: 700 mL via INTRAVENOUS

## 2016-08-21 MED ORDER — MEPERIDINE HCL 25 MG/ML IJ SOLN: 6.2500 mg | INTRAMUSCULAR | Status: DC | PRN

## 2016-08-21 MED ORDER — OXYCODONE-ACETAMINOPHEN 5-325 MG PO TABS
1.0000 | ORAL_TABLET | ORAL | Status: DC | PRN
  Administered 2016-08-23 – 2016-08-24 (×4): 1 via ORAL
  Filled 2016-08-21 (×4): qty 1

## 2016-08-21 SURGICAL SUPPLY — 32 items
CANISTER SUCT 3000ML (MISCELLANEOUS) ×3 IMPLANT
CATH KIT ON-Q SILVERSOAK 5IN (CATHETERS) ×6 IMPLANT
CLOSURE WOUND 1/2 X4 (GAUZE/BANDAGES/DRESSINGS) ×1
DRSG OPSITE POSTOP 4X10 (GAUZE/BANDAGES/DRESSINGS) ×3 IMPLANT
DRSG TELFA 3X8 NADH (GAUZE/BANDAGES/DRESSINGS) ×3 IMPLANT
ELECT CAUTERY BLADE 6.4 (BLADE) IMPLANT
ELECT REM PT RETURN 9FT ADLT (ELECTROSURGICAL) ×3
ELECTRODE REM PT RTRN 9FT ADLT (ELECTROSURGICAL) ×1 IMPLANT
GAUZE SPONGE 4X4 12PLY STRL (GAUZE/BANDAGES/DRESSINGS) ×3 IMPLANT
GLOVE BIO SURGEON STRL SZ7 (GLOVE) ×12 IMPLANT
GLOVE INDICATOR 7.5 STRL GRN (GLOVE) ×12 IMPLANT
GOWN STRL REUS W/ TWL LRG LVL3 (GOWN DISPOSABLE) ×3 IMPLANT
GOWN STRL REUS W/TWL LRG LVL3 (GOWN DISPOSABLE) ×6
LIQUID BAND (GAUZE/BANDAGES/DRESSINGS) ×6 IMPLANT
NS IRRIG 1000ML POUR BTL (IV SOLUTION) ×3 IMPLANT
PACK C SECTION AR (MISCELLANEOUS) ×3 IMPLANT
PAD OB MATERNITY 4.3X12.25 (PERSONAL CARE ITEMS) ×6 IMPLANT
PAD PREP 24X41 OB/GYN DISP (PERSONAL CARE ITEMS) ×3 IMPLANT
SPONGE LAP 18X18 5 PK (GAUZE/BANDAGES/DRESSINGS) IMPLANT
STRIP CLOSURE SKIN 1/2X4 (GAUZE/BANDAGES/DRESSINGS) ×2 IMPLANT
SUCT VACUUM KIWI BELL (SUCTIONS) ×3 IMPLANT
SUT CHROMIC GUT BROWN 0 54 (SUTURE) ×1 IMPLANT
SUT CHROMIC GUT BROWN 0 54IN (SUTURE) ×3
SUT MNCRL 4-0 (SUTURE)
SUT MNCRL 4-0 27XMFL (SUTURE)
SUT PDS AB 1 TP1 96 (SUTURE) ×3 IMPLANT
SUT PLAIN 2 0 XLH (SUTURE) IMPLANT
SUT VIC AB 0 CT1 36 (SUTURE) ×12 IMPLANT
SUT VIC AB 3-0 SH 27 (SUTURE) ×2
SUT VIC AB 3-0 SH 27X BRD (SUTURE) ×1 IMPLANT
SUTURE MNCRL 4-0 27XMF (SUTURE) IMPLANT
SWABSTK COMLB BENZOIN TINCTURE (MISCELLANEOUS) ×3 IMPLANT

## 2016-08-21 NOTE — Anesthesia Preprocedure Evaluation (Signed)
Anesthesia Evaluation  Patient identified by MRN, date of birth, ID band Patient awake    Reviewed: Allergy & Precautions, H&P , NPO status , Patient's Chart, lab work & pertinent test results, reviewed documented beta blocker date and time   History of Anesthesia Complications Negative for: history of anesthetic complications  Airway Mallampati: III  TM Distance: >3 FB Neck ROM: full    Dental no notable dental hx. (+) Partial Upper, Teeth Intact   Pulmonary neg shortness of breath, asthma (as a child) , neg sleep apnea, neg COPD, neg recent URI,    Pulmonary exam normal breath sounds clear to auscultation       Cardiovascular Exercise Tolerance: Good negative cardio ROS Normal cardiovascular exam Rhythm:regular Rate:Normal     Neuro/Psych negative neurological ROS  negative psych ROS   GI/Hepatic negative GI ROS, Neg liver ROS,   Endo/Other  diabetes (gestational during first pregnancy)  Renal/GU negative Renal ROS  negative genitourinary   Musculoskeletal   Abdominal   Peds  Hematology  (+) Blood dyscrasia, anemia ,   Anesthesia Other Findings Past Medical History: No date: Anemia No date: Asthma     Comment: childhood asthma No date: Diabetes mellitus without complication (HCC)     Comment: Gestational diabetes (First child only) No date: Headache No date: Morning sickness   Reproductive/Obstetrics negative OB ROS                             Anesthesia Physical Anesthesia Plan  ASA: II  Anesthesia Plan: Spinal   Post-op Pain Management:    Induction:   Airway Management Planned:   Additional Equipment:   Intra-op Plan:   Post-operative Plan:   Informed Consent: I have reviewed the patients History and Physical, chart, labs and discussed the procedure including the risks, benefits and alternatives for the proposed anesthesia with the patient or authorized  representative who has indicated his/her understanding and acceptance.   Dental Advisory Given  Plan Discussed with: Anesthesiologist, CRNA and Surgeon  Anesthesia Plan Comments:         Anesthesia Quick Evaluation

## 2016-08-21 NOTE — Discharge Summary (Signed)
OB Discharge Summary     Patient Name: Mary Suarez DOB: 07/29/1991 MRN: [id number]  Date of admission: 08/21/2016 Delivering MD: Conard Novak, MD  Date of Delivery: 08/21/2016  Date of discharge: 08/24/2016  Admitting diagnosis: prior csection Intrauterine pregnancy: [redacted]w[redacted]d     Secondary diagnosis: None     Discharge diagnosis: Term Pregnancy Delivered                                                                                                Post partum procedures:none  Complications: None  Hospital course:  Sceduled C/S   25 y.o. yo G3P2002 at [redacted]w[redacted]d was admitted to the hospital 08/21/2016 for scheduled cesarean section with the following indication:Elective Repeat.  Membrane Rupture Time/Date:   ,    Patient delivered a Viable infant.08/21/2016  Details of operation can be found in separate operative note.  Pateint had an uncomplicated postpartum course.  She is ambulating, tolerating a regular diet, passing flatus, and urinating well. Patient is discharged home in stable condition on  08/24/16          Physical exam  Vitals:   08/23/16 2033 08/23/16 2336 08/24/16 0308 08/24/16 0735  BP: 121/77   123/75  Pulse: 79   90  Resp: 20   16  Temp: 97.7 F (36.5 C) 98.2 F (36.8 C) 98.5 F (36.9 C) 98.8 F (37.1 C)  TempSrc: Oral Axillary Oral Oral  SpO2: 100%   98%  Weight:      Height:       General: alert, cooperative and no distress Lochia: appropriate Uterine Fundus: firm Incision: Healing well with no significant drainage, Dressing is clean, dry, and intact DVT Evaluation: No evidence of DVT seen on physical exam.  Labs: Lab Results  Component Value Date   WBC 22.5 (H) 08/22/2016   HGB 10.0 (L) 08/22/2016   HCT 30.1 (L) 08/22/2016   MCV 74.4 (L) 08/22/2016   PLT 192 08/22/2016   CMP Latest Ref Rng & Units 06/21/2016  Glucose 65 - 99 mg/dL 96  BUN 6 - 20 mg/dL 8  Creatinine 4.09 - 8.11 mg/dL 9.14  Sodium 782 - 956 mmol/L 137  Potassium 3.5 - 5.1 mmol/L 3.6   Chloride 101 - 111 mmol/L 107  CO2 22 - 32 mmol/L 21(L)  Calcium 8.9 - 10.3 mg/dL 2.1(H)  Total Protein 6.5 - 8.1 g/dL 6.7  Total Bilirubin 0.3 - 1.2 mg/dL 0.4  Alkaline Phos 38 - 126 U/L 87  AST 15 - 41 U/L 18  ALT 14 - 54 U/L 10(L)    Discharge instruction: per After Visit Summary.  Medications:    Medication List    TAKE these medications   ferrous sulfate 325 (65 FE) MG tablet Take 1 tablet (325 mg total) by mouth daily with breakfast.   ibuprofen 600 MG tablet Commonly known as:  ADVIL,MOTRIN Take 1 tablet (600 mg total) by mouth every 6 (six) hours.   oxyCODONE-acetaminophen 5-325 MG tablet Commonly known as:  PERCOCET/ROXICET Take 1 tablet by mouth every 4 (four) hours as needed for moderate pain (pain score 4-7/10).  Prenatal Vitamin 27-0.8 MG Tabs Take 1 tablet by mouth daily.       Diet: routine diet  Activity: Advance as tolerated. Pelvic rest for 6 weeks.   Outpatient follow up: Follow-up Information    Conard NovakJackson, Stephen D, MD Follow up in 1 week(s).   Specialty:  Obstetrics and Gynecology Why:  incision check Contact information: [address] [place] [address] [phone]             Postpartum contraception: Not Discussed Rhogam Given postpartum: no Rubella vaccine given postpartum: no Varicella vaccine given postpartum: no TDaP given antepartum or postpartum: AP  Newborn Data: Live born female  Birth Weight: 7 lb 13.9 oz (3570 g) APGAR: 8, 9  Baby Feeding: Breast  Disposition:home with mother  SIGNED: Vena AustriaAndreas Gatlyn Lipari, MD

## 2016-08-21 NOTE — Op Note (Signed)
Cesarean Section Operative Note    Koleen DistanceCasey A Ripley   08/21/2016   Pre-operative Diagnosis:  1) intrauterine pregnancy at 2567w2d  2) previous cesarean delivery, desires repeat  Post-operative Diagnosis:  1) intrauterine pregnancy at 4567w2d  2) previous cesarean delivery, desires repeat  Procedure: Repeat low transverse cesarean section via pfannenstiel incision with double layer uterine closure  Surgeon: Surgeon(s) and Role:    * Conard NovakStephen D Jackson, MD - Primary    * Vena AustriaAndreas Staebler, MD - Assisting   Anesthesia: spinal   Findings:  1) normal appearing gravid uterus, fallopian tubes, and ovaries 2) adhesions of left anterior uterus to left anterior abdominal wall 3) viable female infant with weight 3,570 grams, APGARs 8 at 1 minute and 9 at 5 minutes   Estimated Blood Loss: 750 mL  Total IV Fluids: 1,400 ml   Urine Output: 250 ml clear urine at end of case  Specimens: none  Complications: no complications  Disposition: PACU - hemodynamically stable.   Maternal Condition: stable   Baby condition / location:  Couplet care / Skin to Skin  Procedure Details:  The patient was seen in the Holding Room. The risks, benefits, complications, treatment options, and expected outcomes were discussed with the patient. The patient concurred with the proposed plan, giving informed consent. identified as Koleen Distanceasey A Oviatt and the procedure verified as C-Section Delivery. A Time Out was held and the above information confirmed.   After induction of anesthesia, the patient was draped and prepped in the usual sterile manner. A Pfannenstiel incision was made and carried down through the subcutaneous tissue to the fascia. Fascial incision was made and extended transversely. The fascia was separated from the underlying rectus tissue superiorly and inferiorly. The peritoneum was identified and entered. Peritoneal incision was extended longitudinally. The bladder flap was sharply freed from the lower uterine  segment. A low transverse uterine incision was made and the hysterotomy was extended with cranial-caudal tension. Due to high station of fetus a flat kiwi vacuum was utilized to deliver from cephalic presentation was a 3,570 gram Living newborn infant(s) or Female with Apgar scores of 8 at one minute and 9 at five minutes. Cord ph was not sent the umbilical cord was clamped and cut cord blood was obtained for evaluation. The placenta was removed Intact and appeared normal. The uterine outline, tubes and ovaries appeared normal with the above-noted adhesions. The uterine incision was closed with running locked sutures of 0 Vicryl.  A second layer of the same suture was thrown in an imbricating fashion.  Hemostasis was assured.  The uterus was returned to the abdomen and the paracolic gutters were cleared of all clots and debris.  The rectus muscles were inspected and found to be hemostatic.  The On-Q catheter pumps were inserted in accordance with the manufacturer's recommendations.  The catheters were inserted approximately 4cm cephelad to the incision line, approximately 1cm apart, straddling the midline.  They were inserted to a depth of the 4th mark. They were positioned superficial to the rectus abdominus muscles and deep to the rectus fascia.    The fascia was then reapproximated with running sutures of 1-0 PDS, looped. The subcutaneous tissue was reapproximated with 3-0 vicryl using 3 interrupted stitches to reduce tension on the skin closure.  The subcuticular closure was performed using 4-0 monocryl. The skin closure was reinforced using surgical skin glue.  The On-Q catheters were bolused with 5 mL of 0.5% marcaine plain for a total of 10 mL.  The catheters were affixed to the skin with surgical skin glue, steri-strips, and tegaderm.    Instrument, sponge, and needle counts were correct prior the abdominal closure and were correct at the conclusion of the case.  The patient received Ancef 2 gram IV  prior to skin incision (within 30 minutes). For VTE prophylaxis she was wearing SCDs throughout the case.   Signed: Conard Novak, MD 08/21/2016 8:57 AM

## 2016-08-21 NOTE — Transfer of Care (Signed)
Immediate Anesthesia Transfer of Care Note  Patient: Koleen DistanceCasey A Hocker  Procedure(s) Performed: Procedure(s): CESAREAN SECTION/female 7lb 14oz (N/A)  Patient Location: PACU  Anesthesia Type:Spinal  Level of Consciousness: awake, alert , oriented and patient cooperative  Airway & Oxygen Therapy: Patient Spontanous Breathing  Post-op Assessment: Report given to RN, Post -op Vital signs reviewed and stable and Patient moving all extremities X 4  Post vital signs: Reviewed and stable  Last Vitals:  Vitals:   08/21/16 0556 08/21/16 0908  BP: 126/75 115/60  Pulse: 97 83  Resp:  14  Temp:  36.4 C    Last Pain:  Vitals:   08/21/16 0908  TempSrc: Oral  PainSc: 0-No pain         Complications: No apparent anesthesia complications

## 2016-08-21 NOTE — H&P (Signed)
History and Physical Interval Note:  Mary DistanceCasey A Suarez  has presented today for surgery, with the diagnosis of prior csection  The various methods of treatment have been discussed with the patient and family. After consideration of risks, benefits and other options for treatment, the patient has consented to  Procedure(s): CESAREAN SECTION (N/A) as a surgical intervention .  The patient's history has been reviewed, patient examined, no change in status, stable for surgery.  I have reviewed the patient's chart and labs.  Questions were answered to the patient's satisfaction.    Conard NovakJackson, Ameya Kutz D, MD 08/21/2016 7:30 AM

## 2016-08-21 NOTE — Anesthesia Procedure Notes (Addendum)
Spinal  Patient location during procedure: OR Start time: 08/21/2016 7:43 AM End time: 08/21/2016 7:46 AM Staffing Anesthesiologist: Martha Clan Resident/CRNA: Silvana Newness Performed: resident/CRNA  Preanesthetic Checklist Completed: patient identified, site marked, surgical consent, pre-op evaluation, timeout performed, IV checked, risks and benefits discussed and monitors and equipment checked Spinal Block Patient position: sitting Prep: ChloraPrep Patient monitoring: heart rate, continuous pulse ox, blood pressure and cardiac monitor Approach: midline Location: L4-5 Injection technique: single-shot Needle Needle type: Whitacre and Introducer  Needle gauge: 25 G Needle length: 9 cm Assessment Sensory level: T10 Additional Notes Negative paresthesia. Negative blood return. Positive free-flowing CSF. Expiration date of kit checked and confirmed. Patient tolerated procedure well, without complications.

## 2016-08-22 LAB — CBC
HCT: 30.1 % — ABNORMAL LOW (ref 35.0–47.0)
Hemoglobin: 10 g/dL — ABNORMAL LOW (ref 12.0–16.0)
MCH: 24.8 pg — AB (ref 26.0–34.0)
MCHC: 33.4 g/dL (ref 32.0–36.0)
MCV: 74.4 fL — ABNORMAL LOW (ref 80.0–100.0)
PLATELETS: 192 10*3/uL (ref 150–440)
RBC: 4.05 MIL/uL (ref 3.80–5.20)
RDW: 16.5 % — AB (ref 11.5–14.5)
WBC: 22.5 10*3/uL — ABNORMAL HIGH (ref 3.6–11.0)

## 2016-08-22 NOTE — Anesthesia Post-op Follow-up Note (Signed)
  Anesthesia Pain Follow-up Note  Patient: Mary Suarez  Day 1}  Date of Follow-up: 08/22/2016 Time: 7:23 AM  Last Vitals:  Vitals:   08/21/16 2353 08/22/16 0339  BP: 115/72 (!) 102/52  Pulse: 71 63  Resp: 18 18  Temp: 36.5 C 36.5 C    Level of Consciousness: alert  Pain: mild   Side Effects:None  Catheter Site Exam:clean, dry, no drainage     Plan: D/C from anesthesia care  Zachary GeorgeWeatherly,  Glorya Bartley F

## 2016-08-22 NOTE — Progress Notes (Signed)
Admit Date: 08/21/2016 Today's Date: 08/22/2016  Subjective: Postpartum Day 1: Cesarean Delivery Patient reports incisional pain, tolerating PO, + flatus and no problems voiding.    Objective: Vital signs in last 24 hours: Temp:  [96.2 F (35.7 C)-98.3 F (36.8 C)] 98.3 F (36.8 C) (09/08 0820) Pulse Rate:  [59-79] 79 (09/08 0820) Resp:  [16-18] 18 (09/08 0820) BP: (100-120)/(49-72) 120/72 (09/08 0820) SpO2:  [97 %-100 %] 100 % (09/08 0820)  Physical Exam:  General: alert, cooperative and no distress Lochia: appropriate Uterine Fundus: firm Incision: healing well DVT Evaluation: No evidence of DVT seen on physical exam.   Recent Labs  08/20/16 1016 08/22/16 0646  HGB 10.8* 10.0*  HCT 32.1* 30.1*    Assessment/Plan: Status post Cesarean section. Doing well postoperatively.  Continue current care. Plans Breast feeding and eventual mini-pill PNV, Fe Ambulate and reg diet  Mary Suarez 08/22/2016, 9:55 AM

## 2016-08-22 NOTE — Anesthesia Postprocedure Evaluation (Signed)
Anesthesia Post Note  Patient: Mary DistanceCasey A Suarez  Procedure(s) Performed: Procedure(s) (LRB): CESAREAN SECTION/female 7lb 14oz (N/A)  Patient location during evaluation: Mother Baby Anesthesia Type: Spinal Level of consciousness: awake, awake and alert and oriented Pain management: pain level controlled Vital Signs Assessment: post-procedure vital signs reviewed and stable Respiratory status: spontaneous breathing and nonlabored ventilation Cardiovascular status: stable Postop Assessment: no headache, no backache and spinal receding Anesthetic complications: no    Last Vitals:  Vitals:   08/21/16 2353 08/22/16 0339  BP: 115/72 (!) 102/52  Pulse: 71 63  Resp: 18 18  Temp: 36.5 C 36.5 C    Last Pain:  Vitals:   08/22/16 0345  TempSrc:   PainSc: 5                  Zachary GeorgeWeatherly,  Chales Pelissier F

## 2016-08-23 NOTE — Progress Notes (Signed)
  Subjective:  Doing well still reports abdominal pain at site of incision R>L.  Tolerating po.  Passing flatus.  Minimal lochia  Objective:  Blood pressure 122/70, pulse 79, temperature 97.5 F (36.4 C), temperature source Oral, resp. rate 20, height 5\' 6"  (1.676 m), weight 85.3 kg (188 lb), last menstrual period 11/20/2015, SpO2 100 %, unknown if currently breastfeeding.  General: NAD Pulmonary: no increased work of breathing Abdomen: non-distended, non-tender, fundus firm at level of umbilicus Incision: D/C/I Extremities: no edema, no erythema, no tenderness  Results for orders placed or performed during the hospital encounter of 08/21/16 (from the past 72 hour(s))  CBC     Status: Abnormal   Collection Time: 08/22/16  6:46 AM  Result Value Ref Range   WBC 22.5 (H) 3.6 - 11.0 K/uL   RBC 4.05 3.80 - 5.20 MIL/uL   Hemoglobin 10.0 (L) 12.0 - 16.0 g/dL   HCT 16.130.1 (L) 09.635.0 - 04.547.0 %   MCV 74.4 (L) 80.0 - 100.0 fL   MCH 24.8 (L) 26.0 - 34.0 pg   MCHC 33.4 32.0 - 36.0 g/dL   RDW 40.916.5 (H) 81.111.5 - 91.414.5 %   Platelets 192 150 - 440 K/uL     Assessment:   25 y.o. [id number] postoperativeday # 2 RLTCS   Plan:  1) Acute blood loss anemia - hemodynamically stable and asymptomatic - po ferrous sulfate  2) Right sided pain - likely from knot of fascial stitch  3) Breast/MiniPilll  4) Disposition anticipate discharge POD3-4

## 2016-08-24 MED ORDER — OXYCODONE-ACETAMINOPHEN 5-325 MG PO TABS: 1.0000 | ORAL_TABLET | ORAL | 0 refills | Status: DC | PRN

## 2016-08-24 MED ORDER — FERROUS SULFATE 325 (65 FE) MG PO TABS: 325.0000 mg | ORAL_TABLET | Freq: Every day | ORAL | 3 refills | Status: DC

## 2016-08-24 MED ORDER — IBUPROFEN 600 MG PO TABS: 600.0000 mg | ORAL_TABLET | Freq: Four times a day (QID) | ORAL | 0 refills | Status: DC

## 2016-08-24 NOTE — Progress Notes (Signed)
Discharge instructions reviewed and Rx given for home use.

## 2016-08-24 NOTE — Progress Notes (Signed)
Discharged to home.  To car via auxillary. 

## 2018-03-01 ENCOUNTER — Other Ambulatory Visit: Payer: Self-pay

## 2018-03-01 ENCOUNTER — Ambulatory Visit
Admission: EM | Admit: 2018-03-01 | Discharge: 2018-03-01 | Disposition: A | Discharge status: Home / Self Care | Payer: Self-pay | Attending: Family Medicine | Admitting: Family Medicine

## 2018-03-01 DIAGNOSIS — J0100 Acute maxillary sinusitis, unspecified: Secondary | ICD-10-CM

## 2018-03-01 MED ORDER — AMOXICILLIN-POT CLAVULANATE 875-125 MG PO TABS: 1.0000 | ORAL_TABLET | Freq: Two times a day (BID) | ORAL | 0 refills | Status: DC

## 2018-03-01 NOTE — ED Provider Notes (Signed)
[facility] ____________________________________________  Time seen: Approximately 1030 AM  I have reviewed the triage vital signs and the nursing notes.   HISTORY  Chief Complaint Sinusitis   HPI Mary Suarez is a 27 y.o. female presenting for evaluation of 1.5 weeks of nasal congestion, sinus drainage, pressure.  Reports pressure to her cheekbones.  States has been getting a lot of nasal congestion out that is thick, but also states the congestion does quickly builds back up and continued pressure.  Denies accompanying fevers.  States that she felt like this initially started off with a virus or allergies, and states that symptoms started to improve last week but then quickly worsened again with return of pressure.  States some cough associated with postnasal drainage.  Has tried taking Tylenol without change.  Reports is currently nursing her 76-month-old who is healthy, without renal issues.  Reports continues to eat and drink well.  Denies other aggravating or alleviating factors.  Reports otherwise feels well. Denies chest pain, shortness of breath, abdominal pain,  or rash. Denies recent sickness. Denies recent antibiotic use.  Denies pregnancy.  Past Medical History:  Diagnosis Date  . Anemia   . Asthma    childhood asthma  . Diabetes mellitus without complication (HCC)    Gestational diabetes (First child only)  . Headache   . Morning sickness     Patient Active Problem List   Diagnosis Date Noted  . Status post cesarean delivery 08/21/2016  . Supervision of other high risk pregnancies, third trimester 08/21/2016  . [redacted] weeks gestation of pregnancy 08/21/2016  . Abdominal pain affecting pregnancy 07/29/2016  . Urinary tract infection affecting care of mother in third trimester, antepartum 06/23/2016  . Right lower quadrant abdominal pain 06/21/2016  . Diarrhea 06/21/2016  . Nausea 05/10/2016    Past Surgical History:  Procedure Laterality Date  .  CESAREAN SECTION     X 2  . CESAREAN SECTION N/A 08/21/2016   Procedure: CESAREAN SECTION/female 7lb 14oz;  Surgeon: Conard Novak, MD;  Location: [facility] ORS;  Service: Obstetrics;  Laterality: N/A;     No current facility-administered medications for this encounter.   Current Outpatient Medications:  .  amoxicillin-clavulanate (AUGMENTIN) 875-125 MG tablet, Take 1 tablet by mouth every 12 (twelve) hours., Disp: 20 tablet, Rfl: 0  Allergies Phenergan [promethazine hcl]  History reviewed. No pertinent family history.  Social History Social History   Tobacco Use  . Smoking status: Never Smoker  . Smokeless tobacco: Never Used  Substance Use Topics  . Alcohol use: No  . Drug use: No    Review of Systems Constitutional: No fever/chills ENT: No sore throat. As above.  Cardiovascular: Denies chest pain. Respiratory: Denies shortness of breath. Gastrointestinal: No abdominal pain.   Musculoskeletal: Negative for back pain. Skin: Negative for rash.   ____________________________________________   PHYSICAL EXAM:  VITAL SIGNS: ED Triage Vitals  Enc Vitals Group     BP 03/01/18 0919 113/77     Pulse Rate 03/01/18 0919 94     Resp 03/01/18 0919 16     Temp 03/01/18 0919 98.4 F (36.9 C)     Temp Source 03/01/18 0919 Oral     SpO2 03/01/18 0919 100 %     Weight 03/01/18 0916 147 lb (66.7 kg)     Height 03/01/18 0916 5' 6.5" (1.689 m)     Head Circumference --      Peak Flow --      Pain Score 03/01/18  [id number]     Pain Loc --      Pain Edu? --      Excl. in GC? --     Constitutional: Alert and oriented. Well appearing and in no acute distress. Eyes: Conjunctivae are normal. Head: Atraumatic.Mild to moderate tenderness to palpation bilateral maxillary sinuses.  No frontal sinus tenderness palpation.  No swelling. No erythema.   Ears: no erythema, normal TMs bilaterally.   Nose: nasal congestion with bilateral nasal turbinate erythema and edema.   Mouth/Throat: Mucous  membranes are moist.  Oropharynx non-erythematous.No tonsillar swelling or exudate.  Neck: No stridor.  No cervical spine tenderness to palpation. Hematological/Lymphatic/Immunilogical: No cervical lymphadenopathy. Cardiovascular: Normal rate, regular rhythm. Grossly normal heart sounds.  Good peripheral circulation. Respiratory: Normal respiratory effort.  No retractions. No wheezes, rales or rhonchi. Good air movement.   Musculoskeletal:  No cervical, thoracic or lumbar tenderness to palpation. Steady gait. Neurologic:  Normal speech and language. No gait instability. Skin:  Skin is warm, dry and intact. No rash noted. Psychiatric: Mood and affect are normal. Speech and behavior are normal.  ___________________________________________   LABS (all labs ordered are listed, but only abnormal results are displayed)  Labs Reviewed - No data to display _____________________________________________________________________________________   PROCEDURES Procedures   INITIAL IMPRESSION / ASSESSMENT AND PLAN / ED COURSE  Pertinent labs & imaging results that were available during my care of the patient were reviewed by me and considered in my medical decision making (see chart for details).  Well-appearing patient.  No acute distress.  Suspect maxillary sinusitis.  Will treat with oral Augmentin and over-the-counter Claritin or Zyrtec.  Encourage rest, supportive care and fluids.Discussed indication, risks and benefits of medications with patient.  Discussed follow up with Primary care physician this week. Discussed follow up and return parameters including no resolution or any worsening concerns. Patient verbalized understanding and agreed to plan.   ____________________________________________   FINAL CLINICAL IMPRESSION(S) / ED DIAGNOSES  Final diagnoses:  Acute maxillary sinusitis, recurrence not specified     ED Discharge Orders        Ordered    amoxicillin-clavulanate  (AUGMENTIN) 875-125 MG tablet  Every 12 hours     03/01/18 1014       Note: This dictation was prepared with Dragon dictation along with smaller phrase technology. Any transcriptional errors that result from this process are unintentional.         Renford DillsMiller, Kordell Jafri, NP 03/01/18 1121

## 2018-03-01 NOTE — ED Triage Notes (Signed)
Patient complains of sinus pain and pressure, nasal congestion, bilateral ear pain x 10 days that improved and worsened again on Friday.

## 2018-03-01 NOTE — Discharge Instructions (Signed)
Take medication as prescribed. Rest. Drink plenty of fluids.  ° °Follow up with your primary care physician this week as needed. Return to Urgent care for new or worsening concerns.  ° °

## 2018-06-28 ENCOUNTER — Emergency Department
Admission: EM | Admit: 2018-06-28 | Discharge: 2018-06-28 | Disposition: A | Discharge status: Home / Self Care | Payer: Medicaid Other | Attending: Emergency Medicine | Admitting: Emergency Medicine

## 2018-06-28 ENCOUNTER — Other Ambulatory Visit: Payer: Self-pay

## 2018-06-28 ENCOUNTER — Emergency Department: Payer: Medicaid Other

## 2018-06-28 DIAGNOSIS — M79672 Pain in left foot: Secondary | ICD-10-CM | POA: Insufficient documentation

## 2018-06-28 DIAGNOSIS — Z8632 Personal history of gestational diabetes: Secondary | ICD-10-CM | POA: Diagnosis not present

## 2018-06-28 DIAGNOSIS — R200 Anesthesia of skin: Secondary | ICD-10-CM | POA: Diagnosis not present

## 2018-06-28 DIAGNOSIS — J45909 Unspecified asthma, uncomplicated: Secondary | ICD-10-CM | POA: Insufficient documentation

## 2018-06-28 MED ORDER — HYDROCODONE-ACETAMINOPHEN 5-325 MG PO TABS: 1.0000 | ORAL_TABLET | Freq: Four times a day (QID) | ORAL | 0 refills | Status: AC | PRN

## 2018-06-28 NOTE — ED Triage Notes (Signed)
Pt arrives to ED via POV with c/o left foot and ankle pain s/p fall. Pt reports chasing a toddler at the pool and slipped. Pt reports numbness to the last three toes; no obvious deformity or dislocation; CMS intact.

## 2018-06-29 NOTE — ED Provider Notes (Signed)
[facility] Emergency Department Provider Note  ____________________________________________  Time seen: Approximately 12:11 AM  I have reviewed the triage vital signs and the nursing notes.   HISTORY  Chief Complaint Foot Pain    HPI Mary Suarez is a 27 y.o. female presents to the emergency department with left foot pain after patient reports that she fell while chasing her toddler at the pool.  Patient reports pain over the fifth metatarsal.  She denies prior left foot issues.  She currently rates her pain at 10 out of 10 in intensity and describes it as aching and sore.  She does have some numbness along the fourth and fifth left toes.  No alleviating medications were attempted prior to presenting to the emergency department.   Past Medical History:  Diagnosis Date  . Anemia   . Asthma    childhood asthma  . Diabetes mellitus without complication (HCC)    Gestational diabetes (First child only)  . Headache   . Morning sickness     Patient Active Problem List   Diagnosis Date Noted  . Status post cesarean delivery 08/21/2016  . Supervision of other high risk pregnancies, third trimester 08/21/2016  . [redacted] weeks gestation of pregnancy 08/21/2016  . Abdominal pain affecting pregnancy 07/29/2016  . Urinary tract infection affecting care of mother in third trimester, antepartum 06/23/2016  . Right lower quadrant abdominal pain 06/21/2016  . Diarrhea 06/21/2016  . Nausea 05/10/2016    Past Surgical History:  Procedure Laterality Date  . CESAREAN SECTION     X 2  . CESAREAN SECTION N/A 08/21/2016   Procedure: CESAREAN SECTION/female 7lb 14oz;  Surgeon: Conard Novak, MD;  Location: [facility] ORS;  Service: Obstetrics;  Laterality: N/A;    Prior to Admission medications   Medication Sig Start Date End Date Taking? Authorizing Provider  amoxicillin-clavulanate (AUGMENTIN) 875-125 MG tablet Take 1 tablet by mouth every 12 (twelve) hours. 03/01/18   Renford Dills, NP  HYDROcodone-acetaminophen (NORCO) 5-325 MG tablet Take 1 tablet by mouth every 6 (six) hours as needed for up to 3 days for moderate pain. 06/28/18 07/01/18  Orvil Feil, PA-C    Allergies Phenergan [promethazine hcl]  No family history on file.  Social History Social History   Tobacco Use  . Smoking status: Never Smoker  . Smokeless tobacco: Never Used  Substance Use Topics  . Alcohol use: No  . Drug use: No     Review of Systems  Constitutional: No fever/chills Eyes: No visual changes. No discharge ENT: No upper respiratory complaints. Cardiovascular: no chest pain. Respiratory: no cough. No SOB. Gastrointestinal: No abdominal pain.  No nausea, no vomiting.  No diarrhea.  No constipation. Musculoskeletal: Patient has left foot pain. Skin: Negative for rash, abrasions, lacerations, ecchymosis. Neurological: Negative for headaches, focal weakness or numbness.   ____________________________________________   PHYSICAL EXAM:  VITAL SIGNS: ED Triage Vitals  Enc Vitals Group     BP 06/28/18 2142 125/78     Pulse Rate 06/28/18 2142 74     Resp 06/28/18 2142 18     Temp 06/28/18 2142 98.3 F (36.8 C)     Temp Source 06/28/18 2142 Oral     SpO2 06/28/18 2142 98 %     Weight 06/28/18 2144 142 lb (64.4 kg)     Height 06/28/18 2144 5\' 6"  (1.676 m)     Head Circumference --      Peak Flow --      Pain  Score 06/28/18 2144 10     Pain Loc --      Pain Edu? --      Excl. in GC? --      Constitutional: Alert and oriented. Well appearing and in no acute distress. Eyes: Conjunctivae are normal. PERRL. EOMI. Head: Atraumatic.  Cardiovascular: Normal rate, regular rhythm. Normal S1 and S2.  Good peripheral circulation. Respiratory: Normal respiratory effort without tachypnea or retractions. Lungs CTAB. Good air entry to the bases with no decreased or absent breath sounds. Musculoskeletal:.  Patient performs limited range of motion at the left ankle, likely  secondary to pain.  She has exquisite tenderness to palpation over the fifth metatarsal.  Palpable dorsalis pedis pulse, left. Neurologic:  Normal speech and language. No gross focal neurologic deficits are appreciated.  Skin:  Skin is warm, dry and intact. No rash noted. Psychiatric: Mood and affect are normal. Speech and behavior are normal. Patient exhibits appropriate insight and judgement.   ____________________________________________   LABS (all labs ordered are listed, but only abnormal results are displayed)  Labs Reviewed - No data to display ____________________________________________  EKG   ____________________________________________  RADIOLOGY I personally viewed and evaluated these images as part of my medical decision making, as well as reviewing the written report by the radiologist.  Dg Ankle Complete Left  Result Date: 06/28/2018 CLINICAL DATA:  27 year old female with trauma to the left ankle. EXAM: LEFT ANKLE COMPLETE - 3+ VIEW COMPARISON:  None. FINDINGS: Nondisplaced faint linear lucency through the base of the fifth metatarsal likely represents a nutrient growth. A fracture is much less likely. Correlation with clinical exam and point tenderness recommended. No definite acute fracture identified. There is no dislocation. The bones are well mineralized. The ankle mortise is intact. The soft tissues are unremarkable. IMPRESSION: No definite acute fracture or dislocation. Linear lucency at the base of the fifth metatarsal likely a vascular groove. Correlation with clinical exam and point tenderness recommended. Electronically Signed   By: Elgie Collard M.D.   On: 06/28/2018 22:08    ____________________________________________    PROCEDURES  Procedure(s) performed:    Procedures    Medications - No data to display   ____________________________________________   INITIAL IMPRESSION / ASSESSMENT AND PLAN / ED COURSE  Pertinent labs & imaging  results that were available during my care of the patient were reviewed by me and considered in my medical decision making (see chart for details).  Review of the [place] CSRS was performed in accordance of the [facility] prior to dispensing any controlled drugs.  Clinical Course as of Jun 29 10  Mon Jun 28, 2018  2255 DG Ankle Complete Left [JW]    Clinical Course User Index [JW] Orvil Feil, PA-C   Assessment and plan Left foot pain Patient presents to the emergency department with left foot pain after a fall that occurred tonight.  Differential diagnosis included contusion versus fracture.  Hairline fracture was identified of the fifth metatarsal on x-ray.  A postop shoe was applied in the emergency department and crutches were given.  Patient was referred to podiatry and discharged with a short course of Norco.  All patient questions were answered.    ____________________________________________  FINAL CLINICAL IMPRESSION(S) / ED DIAGNOSES  Final diagnoses:  Foot pain, left      NEW MEDICATIONS STARTED DURING THIS VISIT:  ED Discharge Orders        Ordered    HYDROcodone-acetaminophen (NORCO) 5-325 MG tablet  Every 6 hours PRN  06/28/18 2337          This chart was dictated using voice recognition software/Dragon. Despite best efforts to proofread, errors can occur which can change the meaning. Any change was purely unintentional.    Orvil FeilWoods, Jaclyn M, PA-C 06/29/18 0015    Phineas SemenGoodman, Gray[place], MD 06/29/18 [phone]

## 2018-08-11 ENCOUNTER — Other Ambulatory Visit: Payer: Self-pay

## 2018-08-11 ENCOUNTER — Encounter: Payer: Self-pay | Admitting: Emergency Medicine

## 2018-08-11 ENCOUNTER — Ambulatory Visit
Admission: EM | Admit: 2018-08-11 | Discharge: 2018-08-11 | Disposition: A | Discharge status: Home / Self Care | Payer: Medicaid Other | Attending: Family Medicine | Admitting: Family Medicine

## 2018-08-11 DIAGNOSIS — J020 Streptococcal pharyngitis: Secondary | ICD-10-CM

## 2018-08-11 DIAGNOSIS — Z888 Allergy status to other drugs, medicaments and biological substances status: Secondary | ICD-10-CM | POA: Insufficient documentation

## 2018-08-11 DIAGNOSIS — J45909 Unspecified asthma, uncomplicated: Secondary | ICD-10-CM | POA: Insufficient documentation

## 2018-08-11 DIAGNOSIS — E119 Type 2 diabetes mellitus without complications: Secondary | ICD-10-CM | POA: Insufficient documentation

## 2018-08-11 LAB — RAPID STREP SCREEN (MED CTR MEBANE ONLY): Streptococcus, Group A Screen (Direct): POSITIVE — AB

## 2018-08-11 MED ORDER — PENICILLIN G BENZATHINE 1200000 UNIT/2ML IM SUSP
1.2000 10*6.[IU] | Freq: Once | INTRAMUSCULAR | Status: AC
  Administered 2018-08-11: 1.2 10*6.[IU] via INTRAMUSCULAR

## 2018-08-11 MED ORDER — LIDOCAINE VISCOUS HCL 2 % MT SOLN: OROMUCOSAL | 0 refills | Status: DC

## 2018-08-11 NOTE — ED Provider Notes (Signed)
[facility]    CSN: [id number] Arrival date & time: 08/11/18  0813     History   Chief Complaint Chief Complaint  Patient presents with  . Sore Throat    HPI Koleen DistanceCasey A Kainz is a 27 y.o. female.   The history is provided by the patient.  Sore Throat  This is a new problem. The current episode started more than 2 days ago. The problem occurs constantly. The problem has been gradually worsening. Pertinent negatives include no chest pain, no abdominal pain, no headaches and no shortness of breath. The symptoms are aggravated by swallowing. The symptoms are relieved by acetaminophen and NSAIDs. The treatment provided no relief.    Past Medical History:  Diagnosis Date  . Anemia   . Asthma    childhood asthma  . Diabetes mellitus without complication (HCC)    Gestational diabetes (First child only)  . Headache   . Morning sickness     Patient Active Problem List   Diagnosis Date Noted  . Status post cesarean delivery 08/21/2016  . Supervision of other high risk pregnancies, third trimester 08/21/2016  . [redacted] weeks gestation of pregnancy 08/21/2016  . Abdominal pain affecting pregnancy 07/29/2016  . Urinary tract infection affecting care of mother in third trimester, antepartum 06/23/2016  . Right lower quadrant abdominal pain 06/21/2016  . Diarrhea 06/21/2016  . Nausea 05/10/2016    Past Surgical History:  Procedure Laterality Date  . CESAREAN SECTION     X 2  . CESAREAN SECTION N/A 08/21/2016   Procedure: CESAREAN SECTION/female 7lb 14oz;  Surgeon: Conard NovakStephen D Jackson, MD;  Location: [facility] ORS;  Service: Obstetrics;  Laterality: N/A;    OB History    Gravida  3   Para  3   Term  3   Preterm      AB      Living  3     SAB      TAB      Ectopic      Multiple  0   Live Births  3            Home Medications    Prior to Admission medications   Medication Sig Start Date End Date Taking? Authorizing Provider  amoxicillin-clavulanate  (AUGMENTIN) 875-125 MG tablet Take 1 tablet by mouth every 12 (twelve) hours. 03/01/18   Renford DillsMiller, Lindsey, NP  lidocaine (XYLOCAINE) 2 % solution 20 ml gargle and spit q 6 hours prn sore throat 08/11/18   Payton Mccallumonty, Carvel Huskins, MD    Family History No family history on file.  Social History Social History   Tobacco Use  . Smoking status: Never Smoker  . Smokeless tobacco: Never Used  Substance Use Topics  . Alcohol use: No  . Drug use: No     Allergies   Phenergan [promethazine hcl]   Review of Systems Review of Systems  Respiratory: Negative for shortness of breath.   Cardiovascular: Negative for chest pain.  Gastrointestinal: Negative for abdominal pain.  Neurological: Negative for headaches.     Physical Exam Triage Vital Signs ED Triage Vitals  Enc Vitals Group     BP 08/11/18 0828 108/83     Pulse Rate 08/11/18 0828 92     Resp 08/11/18 0828 18     Temp 08/11/18 0828 98.5 F (36.9 C)     Temp Source 08/11/18 0828 Oral     SpO2 08/11/18 0828 99 %     Weight 08/11/18 0825 142 lb (64.4  kg)     Height 08/11/18 0825 5\' 6"  (1.676 m)     Head Circumference --      Peak Flow --      Pain Score 08/11/18 0825 4     Pain Loc --      Pain Edu? --      Excl. in GC? --    No data found.  Updated Vital Signs BP 108/83 (BP Location: Left Arm)   Pulse 92   Temp 98.5 F (36.9 C) (Oral)   Resp 18   Ht 5\' 6"  (1.676 m)   Wt 64.4 kg   SpO2 99%   BMI 22.92 kg/m   Visual Acuity Right Eye Distance:   Left Eye Distance:   Bilateral Distance:    Right Eye Near:   Left Eye Near:    Bilateral Near:     Physical Exam  Constitutional: She appears well-developed and well-nourished. No distress.  HENT:  Head: Normocephalic and atraumatic.  Mouth/Throat: Uvula is midline. Oropharyngeal exudate and posterior oropharyngeal erythema present. No posterior oropharyngeal edema or tonsillar abscesses. No tonsillar exudate.  Neck: Normal range of motion. Neck supple.    Lymphadenopathy:    She has cervical adenopathy.  Skin: She is not diaphoretic.  Nursing note and vitals reviewed.    UC Treatments / Results  Labs (all labs ordered are listed, but only abnormal results are displayed) Labs Reviewed  RAPID STREP SCREEN ([facility] ONLY) - Abnormal; Notable for the following components:      Result Value   Streptococcus, Group A Screen (Direct) POSITIVE (*)    All other components within normal limits    EKG None  Radiology No results found.  Procedures Procedures (including critical care time)  Medications Ordered in UC Medications  penicillin g benzathine (BICILLIN LA) 1200000 UNIT/2ML injection 1.2 Million Units (1.2 Million Units Intramuscular Given 08/11/18 0859)    Initial Impression / Assessment and Plan / UC Course  I have reviewed the triage vital signs and the nursing notes.  Pertinent labs & imaging results that were available during my care of the patient were reviewed by me and considered in my medical decision making (see chart for details).      Final Clinical Impressions(s) / UC Diagnoses   Final diagnoses:  Strep pharyngitis   Discharge Instructions   None    ED Prescriptions    Medication Sig Dispense Auth. Provider   lidocaine (XYLOCAINE) 2 % solution 20 ml gargle and spit q 6 hours prn sore throat 100 mL Payton Mccallum, MD     1. Lab results and diagnosis reviewed with patient 2. rx as per orders above; reviewed possible side effects, interactions, risks and benefits  3. Patient given Bicillin LA 1.60mU IM x1 4. Recommend supportive treatment with otc analgesics prn 5. Follow-up prn if symptoms worsen or don't improve  Controlled Substance Prescriptions [place] Controlled Substance Registry consulted? Not Applicable   Payton Mccallum, MD 08/11/18 [phone]

## 2018-08-11 NOTE — ED Triage Notes (Signed)
Patient c/o sore throat x 1 week. Patient stated yesterday she noticed white patches on the back of her throat.

## 2019-05-13 ENCOUNTER — Other Ambulatory Visit: Payer: Self-pay

## 2019-05-13 ENCOUNTER — Ambulatory Visit
Admission: EM | Admit: 2019-05-13 | Discharge: 2019-05-13 | Disposition: A | Discharge status: Home / Self Care | Payer: Medicaid Other | Attending: Family Medicine | Admitting: Family Medicine

## 2019-05-13 ENCOUNTER — Encounter: Payer: Self-pay | Admitting: Emergency Medicine

## 2019-05-13 DIAGNOSIS — S29012A Strain of muscle and tendon of back wall of thorax, initial encounter: Secondary | ICD-10-CM

## 2019-05-13 MED ORDER — CYCLOBENZAPRINE HCL 10 MG PO TABS: 10.0000 mg | ORAL_TABLET | Freq: Three times a day (TID) | ORAL | 0 refills | Status: DC | PRN

## 2019-05-13 NOTE — ED Provider Notes (Signed)
[facility]    CSN: [id number] Arrival date & time: 05/13/19  0175     History   Chief Complaint Chief Complaint  Patient presents with  . Neck Pain    HPI Mary Suarez is a 28 y.o. female.   28 yo female with a c/o right sided upper back pain radiating into her neck and also associated with tingling and pain down her right arm. Denies any falls, injuries, rash, fevers, chills.    Neck Pain    Past Medical History:  Diagnosis Date  . Anemia   . Asthma    childhood asthma  . Diabetes mellitus without complication (HCC)    Gestational diabetes (First child only)  . Headache   . Morning sickness     Patient Active Problem List   Diagnosis Date Noted  . Status post cesarean delivery 08/21/2016  . Supervision of other high risk pregnancies, third trimester 08/21/2016  . [redacted] weeks gestation of pregnancy 08/21/2016  . Abdominal pain affecting pregnancy 07/29/2016  . Urinary tract infection affecting care of mother in third trimester, antepartum 06/23/2016  . Right lower quadrant abdominal pain 06/21/2016  . Diarrhea 06/21/2016  . Nausea 05/10/2016    Past Surgical History:  Procedure Laterality Date  . CESAREAN SECTION     X 2  . CESAREAN SECTION N/A 08/21/2016   Procedure: CESAREAN SECTION/female 7lb 14oz;  Surgeon: Conard Novak, MD;  Location: [facility] ORS;  Service: Obstetrics;  Laterality: N/A;    OB History    Gravida  3   Para  3   Term  3   Preterm      AB      Living  3     SAB      TAB      Ectopic      Multiple  0   Live Births  3            Home Medications    Prior to Admission medications   Medication Sig Start Date End Date Taking? Authorizing Provider  levonorgestrel (MIRENA) 20 MCG/24HR IUD 1 each by Intrauterine route once.   Yes [provider]  amoxicillin-clavulanate (AUGMENTIN) 875-125 MG tablet Take 1 tablet by mouth every 12 (twelve) hours. 03/01/18   Renford Dills, NP  cyclobenzaprine  (FLEXERIL) 10 MG tablet Take 1 tablet (10 mg total) by mouth 3 (three) times daily as needed for muscle spasms. 05/13/19   Payton Mccallum, MD  lidocaine (XYLOCAINE) 2 % solution 20 ml gargle and spit q 6 hours prn sore throat 08/11/18   Payton Mccallum, MD    Family History Family History  Problem Relation Age of Onset  . Healthy Mother   . Healthy Father     Social History Social History   Tobacco Use  . Smoking status: Never Smoker  . Smokeless tobacco: Never Used  Substance Use Topics  . Alcohol use: No  . Drug use: No     Allergies   Phenergan [promethazine hcl]   Review of Systems Review of Systems  Musculoskeletal: Positive for neck pain.     Physical Exam Triage Vital Signs ED Triage Vitals  Enc Vitals Group     BP 05/13/19 0948 101/66     Pulse Rate 05/13/19 0948 61     Resp 05/13/19 0948 14     Temp 05/13/19 0948 97.9 F (36.6 C)     Temp Source 05/13/19 0948 Oral     SpO2 05/13/19 0948 100 %  Weight 05/13/19 0945 163 lb (73.9 kg)     Height 05/13/19 0945 5\' 7"  (1.702 m)     Head Circumference --      Peak Flow --      Pain Score 05/13/19 0945 6     Pain Loc --      Pain Edu? --      Excl. in GC? --    No data found.  Updated Vital Signs BP 101/66 (BP Location: Left Arm)   Pulse 61   Temp 97.9 F (36.6 C) (Oral)   Resp 14   Ht 5\' 7"  (1.702 m)   Wt 73.9 kg   SpO2 100%   Breastfeeding No   BMI 25.53 kg/m   Visual Acuity Right Eye Distance:   Left Eye Distance:   Bilateral Distance:    Right Eye Near:   Left Eye Near:    Bilateral Near:     Physical Exam Vitals signs and nursing note reviewed.  Constitutional:      General: She is not in acute distress.    Appearance: She is not toxic-appearing or diaphoretic.  Musculoskeletal:     Cervical back: She exhibits tenderness (over the right trapezius muscle) and spasm. She exhibits normal range of motion, no bony tenderness, no swelling, no edema, no deformity and normal pulse.      Comments: Right upper extremity neurovascularly intact  Neurological:     Mental Status: She is alert.      UC Treatments / Results  Labs (all labs ordered are listed, but only abnormal results are displayed) Labs Reviewed - No data to display  EKG None  Radiology No results found.  Procedures Procedures (including critical care time)  Medications Ordered in UC Medications - No data to display  Initial Impression / Assessment and Plan / UC Course  I have reviewed the triage vital signs and the nursing notes.  Pertinent labs & imaging results that were available during my care of the patient were reviewed by me and considered in my medical decision making (see chart for details).     Final Clinical Impressions(s) / UC Diagnoses   Final diagnoses:  Upper back strain, initial encounter     Discharge Instructions     Heat to the area (three times a day for 15-20 minutes at a time) Stretches, massage Over the counter anti-inflammatory (advil, motrin, or aleve)    ED Prescriptions    Medication Sig Dispense Auth. Provider   cyclobenzaprine (FLEXERIL) 10 MG tablet Take 1 tablet (10 mg total) by mouth 3 (three) times daily as needed for muscle spasms. 30 tablet Payton Mccallumonty, Brenetta Penny, MD     1. diagnosis reviewed with patient 2. rx as per orders above; reviewed possible side effects, interactions, risks and benefits  3. Recommend supportive treatment as above 4. Follow-up prn if symptoms worsen or don't improve   Controlled Substance Prescriptions [place] Controlled Substance Registry consulted?    Payton Mccallumonty, Asuna Peth, MD 05/13/19 1026

## 2019-05-13 NOTE — Discharge Instructions (Signed)
Heat to the area (three times a day for 15-20 minutes at a time) Stretches, massage Over the counter anti-inflammatory (advil, motrin, or aleve)

## 2019-05-13 NOTE — ED Triage Notes (Signed)
Patient c/o neck pain that started on Sunday and is having some tingling and pain down her right arm.  Patient denies injury or fall.

## 2019-08-19 ENCOUNTER — Ambulatory Visit (INDEPENDENT_AMBULATORY_CARE_PROVIDER_SITE_OTHER): Payer: Medicaid Other | Admitting: Obstetrics and Gynecology

## 2019-08-19 ENCOUNTER — Other Ambulatory Visit: Payer: Self-pay

## 2019-08-19 ENCOUNTER — Other Ambulatory Visit (HOSPITAL_COMMUNITY)
Admission: RE | Admit: 2019-08-19 | Discharge: 2019-08-19 | Disposition: A | Discharge status: Home / Self Care | Payer: Medicaid Other | Source: Ambulatory Visit | Attending: Obstetrics and Gynecology | Admitting: Obstetrics and Gynecology

## 2019-08-19 ENCOUNTER — Encounter: Payer: Self-pay | Admitting: Obstetrics and Gynecology

## 2019-08-19 VITALS — BP 124/88 | HR 70 | Ht 66.0 in | Wt 169.0 lb

## 2019-08-19 DIAGNOSIS — N912 Amenorrhea, unspecified: Secondary | ICD-10-CM

## 2019-08-19 DIAGNOSIS — E538 Deficiency of other specified B group vitamins: Secondary | ICD-10-CM

## 2019-08-19 DIAGNOSIS — F419 Anxiety disorder, unspecified: Secondary | ICD-10-CM

## 2019-08-19 DIAGNOSIS — Z124 Encounter for screening for malignant neoplasm of cervix: Secondary | ICD-10-CM

## 2019-08-19 DIAGNOSIS — Z0000 Encounter for general adult medical examination without abnormal findings: Secondary | ICD-10-CM | POA: Diagnosis not present

## 2019-08-19 DIAGNOSIS — Z01419 Encounter for gynecological examination (general) (routine) without abnormal findings: Secondary | ICD-10-CM

## 2019-08-19 DIAGNOSIS — Z3202 Encounter for pregnancy test, result negative: Secondary | ICD-10-CM

## 2019-08-19 DIAGNOSIS — F329 Major depressive disorder, single episode, unspecified: Secondary | ICD-10-CM

## 2019-08-19 DIAGNOSIS — Z1329 Encounter for screening for other suspected endocrine disorder: Secondary | ICD-10-CM

## 2019-08-19 DIAGNOSIS — Z1239 Encounter for other screening for malignant neoplasm of breast: Secondary | ICD-10-CM

## 2019-08-19 LAB — POCT URINE PREGNANCY: Preg Test, Ur: NEGATIVE

## 2019-08-19 MED ORDER — ESCITALOPRAM OXALATE 10 MG PO TABS: 10.0000 mg | ORAL_TABLET | Freq: Every day | ORAL | 3 refills | Status: DC

## 2019-08-19 NOTE — Progress Notes (Signed)
Gynecology Annual Exam   PCP: Patient, No Pcp Per  Chief Complaint:  Chief Complaint  Patient presents with  . Gynecologic Exam    Anxiety/ depression   . Amenorrhea    pt requesting UPT today    History of Present Illness: Patient is a 28 y.o. [id number] presents for annual exam. The patient has no complaints today.   LMP: Patient's last menstrual period was 06/03/2019 (exact date). Irregular menses since stopping breast feeding and IUD  The patient is sexually active. She currently uses IUD for contraception. She denies dyspareunia.  There is no notable family history of breast or ovarian cancer in her family.  The patient wears seatbelts: yes.   The patient has regular exercise: not asked.    The patient reports current symptoms of depression.    The patient is a 28 y.o. female presenting initial evaluation for symptoms of anxiety and depression.  The patient is currently taking nothing for the management of her symptoms.  She has had  recent situational stressors..  She reports symptoms of anhedonia, day time somnolence, insomnia, irritability, increased appetite, social anxiety, agorophobia, feelings of guilt, feelings of worthlessness and visual hallucinations.  She denies risk taking behavior, suicidal ideation, homicidal ideation, auditory hallucinations and visual hallucinations.   The patient does not have a pre-existing history of depression and anxiety.  She  does not a prior history of suicide attempts.    Review of Systems: ROS  Past Medical History:  Past Medical History:  Diagnosis Date  . Anemia   . Asthma    childhood asthma  . Diabetes mellitus without complication (HCC)    Gestational diabetes (First child only)  . Headache   . Morning sickness     Past Surgical History:  Past Surgical History:  Procedure Laterality Date  . CESAREAN SECTION     X 2  . CESAREAN SECTION N/A 08/21/2016   Procedure: CESAREAN SECTION/female 7lb 14oz;  Surgeon: Conard NovakStephen D  Jackson, MD;  Location: [facility] ORS;  Service: Obstetrics;  Laterality: N/A;    Gynecologic History:  Patient's last menstrual period was 06/03/2019 (exact date). Contraception:09/30/2016 Mirena IUD Last Pap: Results were:09/30/2016 no abnormalities   Obstetric History: [id number]  Family History:  Family History  Problem Relation Age of Onset  . Healthy Mother   . Healthy Father     Social History:  Social History   Socioeconomic History  . Marital status: Single    Spouse name: Not on file  . Number of children: Not on file  . Years of education: Not on file  . Highest education level: Not on file  Occupational History  . Not on file  Social Needs  . Financial resource strain: Not on file  . Food insecurity    Worry: Not on file    Inability: Not on file  . Transportation needs    Medical: Not on file    Non-medical: Not on file  Tobacco Use  . Smoking status: Never Smoker  . Smokeless tobacco: Never Used  Substance and Sexual Activity  . Alcohol use: No  . Drug use: No  . Sexual activity: Yes    Birth control/protection: I.U.D.  Lifestyle  . Physical activity    Days per week: Not on file    Minutes per session: Not on file  . Stress: Not on file  Relationships  . Social Musicianconnections    Talks on phone: Not on file    Gets together: Not on  file    Attends religious service: Not on file    Active member of club or organization: Not on file    Attends meetings of clubs or organizations: Not on file    Relationship status: Not on file  . Intimate partner violence    Fear of current or ex partner: Not on file    Emotionally abused: Not on file    Physically abused: Not on file    Forced sexual activity: Not on file  Other Topics Concern  . Not on file  Social History Narrative  . Not on file    Allergies:  Allergies  Allergen Reactions  . Phenergan [Promethazine Hcl] Other (See Comments)    Seizures    Medications: Prior to Admission medications    Medication Sig Start Date End Date Taking? Authorizing Provider  levonorgestrel (MIRENA) 20 MCG/24HR IUD 1 each by Intrauterine route once.   Yes [provider]    Physical Exam Vitals: Blood pressure 124/88, pulse 70, height 5\' 6"  (1.676 m), weight 169 lb (76.7 kg), last menstrual period 06/03/2019.  General: NAD HEENT: normocephalic, anicteric Thyroid: no enlargement, no palpable nodules Pulmonary: No increased work of breathing, CTAB Cardiovascular: RRR, distal pulses 2+ Breast: Breast symmetrical, no tenderness, no palpable nodules or masses, no skin or nipple retraction present, no nipple discharge.  No axillary or supraclavicular lymphadenopathy. Abdomen: NABS, soft, non-tender, non-distended.  Umbilicus without lesions.  No hepatomegaly, splenomegaly or masses palpable. No evidence of hernia  Genitourinary:  External: Normal external female genitalia.  Normal urethral meatus, normal Bartholin's and Skene's glands.    Vagina: Normal vaginal mucosa, no evidence of prolapse.    Cervix: Grossly normal in appearance, no bleeding  Uterus: Non-enlarged, mobile, normal contour.  No CMT IUD strings 2cm  Adnexa: ovaries non-enlarged, no adnexal masses  Rectal: deferred  Lymphatic: no evidence of inguinal lymphadenopathy Extremities: no edema, erythema, or tenderness Neurologic: Grossly intact Psychiatric: mood appropriate, affect full  Female chaperone present for pelvic and breast  portions of the physical exam  GAD 7 : Generalized Anxiety Score 08/19/2019  Nervous, Anxious, on Edge 3  Control/stop worrying 3  Worry too much - different things 3  Trouble relaxing 3  Restless 3  Easily annoyed or irritable 3  Afraid - awful might happen 3  Total GAD 7 Score 21  Anxiety Difficulty Very difficult    Depression screen PHQ 2/9 08/19/2019  Decreased Interest 1  Down, Depressed, Hopeless 3  PHQ - 2 Score 4  Altered sleeping 3  Tired, decreased energy 3  Change in appetite  3  Feeling bad or failure about yourself  3  Trouble concentrating 2  Moving slowly or fidgety/restless 2  Suicidal thoughts 1  PHQ-9 Score 21  Difficult doing work/chores Very difficult     Assessment: 28 y.o. [id number] routine annual exam  Plan: Problem List Items Addressed This Visit    None    Visit Diagnoses    Amenorrhea    -  Primary   Relevant Orders   POCT urine pregnancy (Completed)   Encounter for gynecological examination without abnormal finding       Screening for malignant neoplasm of cervix       Relevant Orders   Cytology - PAP   Breast screening       Thyroid disorder screening       Relevant Orders   TSH   Anxiety and depression       Relevant Medications   escitalopram (  LEXAPRO) 10 MG tablet   Other Relevant Orders   TSH   CBC   B12   B12 deficiency          1) STI screening  was notoffered and therefore not obtained  2)  ASCCP guidelines and rational discussed.  Patient opts for every 3 years screening interval  3) Contraception - the patient is currently using  IUD.  She is happy with her current form of contraception and plans to continue  4) Routine healthcare maintenance including cholesterol, diabetes screening discussed managed by PCP  - will check TSH, CBC, B12 given anxiety/depression - start lexapro 10mg  po daily   5) Return in about 2 weeks (around 09/02/2019) for Medication follow up phone or in office.   Vena Austria, MD, [facility], [facility] 08/19/2019, 9:14 AM

## 2019-08-23 LAB — CYTOLOGY - PAP
Diagnosis: NEGATIVE
HPV: NOT DETECTED

## 2019-08-24 ENCOUNTER — Other Ambulatory Visit: Payer: Medicaid Other

## 2019-08-24 ENCOUNTER — Other Ambulatory Visit: Payer: Self-pay

## 2019-08-25 LAB — CBC
Hematocrit: 38.2 % (ref 34.0–46.6)
Hemoglobin: 12.8 g/dL (ref 11.1–15.9)
MCH: 27.8 pg (ref 26.6–33.0)
MCHC: 33.5 g/dL (ref 31.5–35.7)
MCV: 83 fL (ref 79–97)
Platelets: 252 10*3/uL (ref 150–450)
RBC: 4.61 x10E6/uL (ref 3.77–5.28)
RDW: 12.3 % (ref 11.7–15.4)
WBC: 7 10*3/uL (ref 3.4–10.8)

## 2019-08-25 LAB — TSH: TSH: 0.836 u[IU]/mL (ref 0.450–4.500)

## 2019-08-25 LAB — VITAMIN B12: Vitamin B-12: 650 pg/mL (ref 232–1245)

## 2019-09-05 ENCOUNTER — Other Ambulatory Visit: Payer: Self-pay

## 2019-09-05 ENCOUNTER — Encounter: Payer: Self-pay | Admitting: Obstetrics and Gynecology

## 2019-09-05 ENCOUNTER — Ambulatory Visit (INDEPENDENT_AMBULATORY_CARE_PROVIDER_SITE_OTHER): Payer: Self-pay | Admitting: Obstetrics and Gynecology

## 2019-09-05 DIAGNOSIS — F419 Anxiety disorder, unspecified: Secondary | ICD-10-CM

## 2019-09-05 DIAGNOSIS — F329 Major depressive disorder, single episode, unspecified: Secondary | ICD-10-CM

## 2019-09-05 MED ORDER — ESCITALOPRAM OXALATE 20 MG PO TABS: 20.0000 mg | ORAL_TABLET | Freq: Every day | ORAL | 2 refills | Status: DC

## 2019-09-05 NOTE — Progress Notes (Signed)
I connected with Mary Suarez on 09/06/19 at  8:10 AM EDT by telephone and verified that I am speaking with the correct person using two identifiers.   I discussed the limitations, risks, security and privacy concerns of performing an evaluation and management service by telephone and the availability of in person appointments. I also discussed with the patient that there may be a patient responsible charge related to this service. The patient expressed understanding and agreed to proceed.  The patient was at home I spoke with the patient from my workstation phone The names of people involved in this encounter were: Mary Suarez , and [facility] Visit   Chief Complaint:  Chief Complaint  Patient presents with  . Follow-up    Depression anxiety    History of Present Illness: The patient is a 28 y.o. female presenting follow up for symptoms of anxiety and depression.  The patient is currently taking Lexapro 10 mg for the management of her symptoms.  She has not had any recent situational stressors.  She reports symptoms of anhedonia, risk taking behavior, decreased appetite, social anxiety, agorophobia, feelings of guilt, feelings of worthlessness, suicidal ideation and homicidal ideation.  She denies risk taking behavior, increased appetite, suicidal ideation, homicidal ideation, auditory hallucinations and visual hallucinations. Symptoms have improved since last visit.     The patient does have a pre-existing history of depression and anxiety.  She  does not a prior history of suicide attempts.   Review of Systems: Review of Systems  Constitutional: Negative.   Gastrointestinal: Positive for nausea. Negative for abdominal pain.  Genitourinary: Negative.   Skin: Negative.   Neurological: Negative for headaches.   Past Medical History:  Past Medical History:  Diagnosis Date  . Anemia   . Asthma    childhood asthma  . Diabetes mellitus  without complication ([place])    Gestational diabetes (First child only)  . Headache   . Morning sickness     Past Surgical History:  Past Surgical History:  Procedure Laterality Date  . CESAREAN SECTION     X 2  . CESAREAN SECTION N/A 08/21/2016   Procedure: CESAREAN SECTION/female 7lb 14oz;  Surgeon: Will Bonnet, MD;  Location: [facility] ORS;  Service: Obstetrics;  Laterality: N/A;    Gynecologic History: No LMP recorded. (Menstrual status: IUD).  Obstetric History: [id number]  Family History:  Family History  Problem Relation Age of Onset  . Healthy Mother   . Healthy Father     Social History:  Social History   Socioeconomic History  . Marital status: Single    Spouse name: Not on file  . Number of children: Not on file  . Years of education: Not on file  . Highest education level: Not on file  Occupational History  . Not on file  Social Needs  . Financial resource strain: Not on file  . Food insecurity    Worry: Not on file    Inability: Not on file  . Transportation needs    Medical: Not on file    Non-medical: Not on file  Tobacco Use  . Smoking status: Never Smoker  . Smokeless tobacco: Never Used  Substance and Sexual Activity  . Alcohol use: No  . Drug use: No  . Sexual activity: Yes    Birth control/protection: I.U.D.  Lifestyle  . Physical activity    Days per week: Not on file    Minutes per session: Not  on file  . Stress: Not on file  Relationships  . Social Musicianconnections    Talks on phone: Not on file    Gets together: Not on file    Attends religious service: Not on file    Active member of club or organization: Not on file    Attends meetings of clubs or organizations: Not on file    Relationship status: Not on file  . Intimate partner violence    Fear of current or ex partner: Not on file    Emotionally abused: Not on file    Physically abused: Not on file    Forced sexual activity: Not on file  Other Topics Concern  . Not on file   Social History Narrative  . Not on file    Allergies:  Allergies  Allergen Reactions  . Phenergan [Promethazine Hcl] Other (See Comments)    Seizures    Medications: Prior to Admission medications   Medication Sig Start Date End Date Taking? Authorizing Provider  escitalopram (LEXAPRO) 10 MG tablet Take 1 tablet (10 mg total) by mouth daily. 08/19/19   Vena AustriaStaebler, Hilmer Aliberti, MD  levonorgestrel (MIRENA) 20 MCG/24HR IUD 1 each by Intrauterine route once.    [provider]    Physical Exam Vitals: There were no vitals filed for this visit. No LMP recorded. (Menstrual status: IUD).  No physical exam as this was a remote telephone visit to promote social distancing during the current COVID-19 Pandemic   GAD 7 : Generalized Anxiety Score 09/05/2019 08/19/2019  Nervous, Anxious, on Edge 0 3  Control/stop worrying 1 3  Worry too much - different things 1 3  Trouble relaxing 3 3  Restless 0 3  Easily annoyed or irritable 3 3  Afraid - awful might happen 1 3  Total GAD 7 Score 9 21  Anxiety Difficulty Somewhat difficult Very difficult    Depression screen [facility] 09/05/2019 08/19/2019  Decreased Interest 2 1  Down, Depressed, Hopeless 2 3  PHQ - 2 Score 4 4  Altered sleeping 3 3  Tired, decreased energy 2 3  Change in appetite 0 3  Feeling bad or failure about yourself  2 3  Trouble concentrating 1 2  Moving slowly or fidgety/restless 0 2  Suicidal thoughts 0 1  PHQ-9 Score 12 21  Difficult doing work/chores Very difficult Very difficult    Depression screen [facility] 09/05/2019 08/19/2019  Decreased Interest 2 1  Down, Depressed, Hopeless 2 3  PHQ - 2 Score 4 4  Altered sleeping 3 3  Tired, decreased energy 2 3  Change in appetite 0 3  Feeling bad or failure about yourself  2 3  Trouble concentrating 1 2  Moving slowly or fidgety/restless 0 2  Suicidal thoughts 0 1  PHQ-9 Score 12 21  Difficult doing work/chores Very difficult Very difficult     Assessment: 28  y.o. [id number]   Plan: Problem List Items Addressed This Visit    None    Visit Diagnoses    Anxiety and depression    -  Primary   Relevant Medications   escitalopram (LEXAPRO) 20 MG tablet      1) Anxiety / Depression  - increase Lexapro to 20mg   2) Thyroid and B12 screen has been obtained previously  3) Telephone 12:06  4) Return in about 2 weeks (around 09/19/2019) for Medication follow up phone.    Vena AustriaAndreas Natoya Viscomi, MD, Merlinda FrederickFACOG [facility], [facility] 09/05/2019, 8:34 AM

## 2019-09-19 ENCOUNTER — Ambulatory Visit: Payer: Medicaid Other | Admitting: Obstetrics and Gynecology

## 2019-09-22 ENCOUNTER — Ambulatory Visit: Payer: Medicaid Other | Admitting: Obstetrics and Gynecology

## 2019-10-07 ENCOUNTER — Other Ambulatory Visit: Payer: Self-pay | Admitting: Obstetrics and Gynecology

## 2019-11-02 ENCOUNTER — Other Ambulatory Visit: Payer: Self-pay | Admitting: Obstetrics and Gynecology

## 2019-11-02 MED ORDER — METRONIDAZOLE 500 MG PO TABS: 500.0000 mg | ORAL_TABLET | Freq: Two times a day (BID) | ORAL | 0 refills | Status: AC

## 2019-11-29 ENCOUNTER — Other Ambulatory Visit: Payer: Self-pay | Admitting: Obstetrics and Gynecology

## 2019-11-29 MED ORDER — METRONIDAZOLE 500 MG PO TABS: 500.0000 mg | ORAL_TABLET | Freq: Two times a day (BID) | ORAL | 0 refills | Status: AC

## 2019-11-29 NOTE — Progress Notes (Signed)
metro

## 2020-03-02 ENCOUNTER — Ambulatory Visit: Payer: Medicaid Other | Attending: Internal Medicine

## 2020-03-02 DIAGNOSIS — Z23 Encounter for immunization: Secondary | ICD-10-CM

## 2020-03-02 NOTE — Progress Notes (Signed)
   Covid-19 Vaccination Clinic  Name:  CANDENCE SEASE    MRN: [id number] DOB: 1991/07/10  03/02/2020  Ms. Kissick was observed post Covid-19 immunization for 15 minutes without incident. She was provided with Vaccine Information Sheet and instruction to access the V-Safe system.   Ms. Stillion was instructed to call 911 with any severe reactions post vaccine: Marland Kitchen Difficulty breathing  . Swelling of face and throat  . A fast heartbeat  . A bad rash all over body  . Dizziness and weakness   Immunizations Administered    Name Date Dose VIS Date Route   Moderna COVID-19 Vaccine 03/02/2020  8:55 AM 0.5 mL 11/15/2019 Intramuscular   Manufacturer: Moderna   Lot: [id number]   NDC: 97026-378-58

## 2020-04-03 ENCOUNTER — Ambulatory Visit: Payer: Medicaid Other | Attending: Internal Medicine

## 2020-04-03 DIAGNOSIS — Z23 Encounter for immunization: Secondary | ICD-10-CM

## 2020-04-03 NOTE — Progress Notes (Signed)
   Covid-19 Vaccination Clinic  Name:  Mary Suarez    MRN: [id number] DOB: 1991-01-19  04/03/2020  [name] was observed post Covid-19 immunization for 15 minutes without incident. She was provided with Vaccine Information Sheet and instruction to access the V-Safe system.   [name] was instructed to call 911 with any severe reactions post vaccine: Marland Kitchen Difficulty breathing  . Swelling of face and throat  . A fast heartbeat  . A bad rash all over body  . Dizziness and weakness   Immunizations Administered    Name Date Dose VIS Date Route   Moderna COVID-19 Vaccine 04/03/2020  8:23 AM 0.5 mL 11/2019 Intramuscular   Manufacturer: Moderna   Lot: [id number]   NDC: 90475-339-17

## 2020-07-18 ENCOUNTER — Ambulatory Visit: Payer: BC Managed Care – PPO | Admitting: Advanced Practice Midwife

## 2020-07-20 ENCOUNTER — Other Ambulatory Visit (HOSPITAL_COMMUNITY)
Admission: RE | Admit: 2020-07-20 | Discharge: 2020-07-20 | Disposition: A | Discharge status: Home / Self Care | Payer: BC Managed Care – PPO | Source: Ambulatory Visit | Attending: Advanced Practice Midwife | Admitting: Advanced Practice Midwife

## 2020-07-20 ENCOUNTER — Other Ambulatory Visit: Payer: Self-pay

## 2020-07-20 ENCOUNTER — Ambulatory Visit (INDEPENDENT_AMBULATORY_CARE_PROVIDER_SITE_OTHER): Payer: BC Managed Care – PPO | Admitting: Advanced Practice Midwife

## 2020-07-20 ENCOUNTER — Encounter: Payer: Self-pay | Admitting: Advanced Practice Midwife

## 2020-07-20 VITALS — BP 122/74 | Ht 66.0 in | Wt 161.0 lb

## 2020-07-20 DIAGNOSIS — N760 Acute vaginitis: Secondary | ICD-10-CM | POA: Diagnosis not present

## 2020-07-20 DIAGNOSIS — B9689 Other specified bacterial agents as the cause of diseases classified elsewhere: Secondary | ICD-10-CM

## 2020-07-20 DIAGNOSIS — N898 Other specified noninflammatory disorders of vagina: Secondary | ICD-10-CM | POA: Diagnosis not present

## 2020-07-20 MED ORDER — FLUCONAZOLE 150 MG PO TABS: 150.0000 mg | ORAL_TABLET | Freq: Once | ORAL | 1 refills | Status: AC

## 2020-07-20 MED ORDER — METRONIDAZOLE 0.75 % VA GEL: 1.0000 | Freq: Every day | VAGINAL | 1 refills | Status: AC

## 2020-07-20 NOTE — Progress Notes (Signed)
Patient ID: Mary Suarez, female   DOB: 1991/07/24, 29 y.o.   MRN: [id number]  Reason for Consult: Vaginitis    Subjective:  Date of Service: 07/20/2020  HPI:  Mary Suarez is a 29 y.o. female being seen for symptoms of vaginal discharge and itching that began 8 days ago. The discharge is thick and white. She used 1 dose of OTC monistat 6 days ago with minimal relief. She has had a yeast infection in the past and her symptoms were similar. She does not have concerns for STDs, although she accepts testing. She occasionally uses a pH balancing treatment and that is when she notices symptoms.    Past Medical History:  Diagnosis Date  . Anemia   . Asthma    childhood asthma  . Diabetes mellitus without complication ([facility])    Gestational diabetes (First child only)  . Headache   . Morning sickness    Family History  Problem Relation Age of Onset  . Healthy Mother   . Healthy Father    Past Surgical History:  Procedure Laterality Date  . CESAREAN SECTION     X 2  . CESAREAN SECTION N/A 08/21/2016   Procedure: CESAREAN SECTION/female 7lb 14oz;  Surgeon: Conard Novak, MD;  Location: [facility] ORS;  Service: Obstetrics;  Laterality: N/A;    Short Social History:  Social History   Tobacco Use  . Smoking status: Never Smoker  . Smokeless tobacco: Never Used  Substance Use Topics  . Alcohol use: No    Allergies  Allergen Reactions  . Phenergan [Promethazine Hcl] Other (See Comments)    Seizures    Current Outpatient Medications  Medication Sig Dispense Refill  . escitalopram (LEXAPRO) 20 MG tablet TAKE 1 TABLET BY MOUTH EVERY DAY 90 tablet 1  . fluconazole (DIFLUCAN) 150 MG tablet Take 1 tablet (150 mg total) by mouth once for 1 dose. Can take additional dose three days later if symptoms persist 1 tablet 1  . levonorgestrel (MIRENA) 20 MCG/24HR IUD 1 each by Intrauterine route once.    . metroNIDAZOLE (METROGEL) 0.75 % vaginal gel Place 1 Applicatorful vaginally at bedtime for  5 days. 70 g 1   No current facility-administered medications for this visit.    Review of Systems  Constitutional: Negative for chills and fever.  HENT: Negative for congestion, ear discharge, ear pain, hearing loss, sinus pain and sore throat.   Eyes: Negative for blurred vision and double vision.  Respiratory: Negative for cough, shortness of breath and wheezing.   Cardiovascular: Negative for chest pain, palpitations and leg swelling.  Gastrointestinal: Negative for abdominal pain, blood in stool, constipation, diarrhea, heartburn, melena, nausea and vomiting.  Genitourinary: Negative for dysuria, flank pain, frequency, hematuria and urgency.       Positive for vaginal itching and discharge  Musculoskeletal: Negative for back pain, joint pain and myalgias.  Skin: Negative for itching and rash.  Neurological: Negative for dizziness, tingling, tremors, sensory change, speech change, focal weakness, seizures, loss of consciousness, weakness and headaches.  Endo/Heme/Allergies: Negative for environmental allergies. Does not bruise/bleed easily.  Psychiatric/Behavioral: Negative for depression, hallucinations, memory loss, substance abuse and suicidal ideas. The patient is not nervous/anxious and does not have insomnia.          Objective:  Objective   Vitals:   07/20/20 1628  BP: 122/74  Weight: 161 lb (73 kg)  Height: 5\' 6"  (1.676 m)   Body mass index is 25.99 kg/m. Constitutional: Well nourished, well  developed female in no acute distress.  HEENT: normal Skin: Warm and dry.  Respiratory: Clear to auscultation bilateral. Normal respiratory effort Extremities: no edema Neuro: DTRs 2+, Cranial nerves grossly intact Psych: Alert and Oriented x3. No memory deficits. Normal mood and affect.  MS: normal gait, normal bilateral lower extremity ROM/strength/stability.  Pelvic exam:  is not limited by body habitus EGBUS: within normal limits Vagina: within normal limits and with  normal mucosa, thick creamy white discharge with some clumps Cervix: not evaluated   Data: Wet Prep: negative for yeast, a few clue cells  Assessment/Plan:     29 y.o. G3 P42 female with possible yeast infection and/or BV  Rx Diflucan Rx Metro gel Aptima: vaginitis/STDs Follow up as needed after lab results   Tresea Mall CNM [facility] 07/20/2020, 5:05 PM

## 2020-07-24 ENCOUNTER — Telehealth: Payer: Self-pay

## 2020-07-24 NOTE — Telephone Encounter (Signed)
LMVM to notify patient lab has received the specimen, however results aren't expected until tomorrow afternoon. Advised to return call with current/ongoing sypmtoms and advised to be sure she has taken the second dose of diflucan that was rx'd to be repeated after day 3 if s&s persists.

## 2020-07-24 NOTE — Telephone Encounter (Signed)
Patient was seen 07/20/2020 by JEG. She hasn't received lab results and is an extra 4 days in pain and would like advice on what to do. Cb#[phone]

## 2020-07-24 NOTE — Telephone Encounter (Signed)
Chart reviewed Cervicovaginal swab shows active (no results yet)

## 2020-07-24 NOTE — Telephone Encounter (Signed)
Spoke w/Mary Suarez at [facility] to verify specimen was received. She states the specimen wasn't received until 9:57 this morning. Results should be ready around 3-4 pm tomorrow.

## 2020-07-25 LAB — CERVICOVAGINAL ANCILLARY ONLY
Bacterial Vaginitis (gardnerella): NEGATIVE
Candida Glabrata: NEGATIVE
Candida Vaginitis: NEGATIVE
Chlamydia: NEGATIVE
Comment: NEGATIVE
Comment: NEGATIVE
Comment: NEGATIVE
Comment: NEGATIVE
Comment: NEGATIVE
Comment: NORMAL
Neisseria Gonorrhea: NEGATIVE
Trichomonas: NEGATIVE

## 2020-07-26 NOTE — Telephone Encounter (Signed)
Look like lab resulted at 4:58pm yesterday. Shows pt has seen results in my chart.

## 2020-07-26 NOTE — Telephone Encounter (Signed)
Message sent to patient via mychart

## 2020-08-22 ENCOUNTER — Other Ambulatory Visit: Payer: Self-pay

## 2020-08-22 ENCOUNTER — Ambulatory Visit: Payer: BC Managed Care – PPO | Admitting: Obstetrics and Gynecology

## 2020-08-22 ENCOUNTER — Other Ambulatory Visit: Payer: Medicaid Other

## 2020-08-22 DIAGNOSIS — Z20822 Contact with and (suspected) exposure to covid-19: Secondary | ICD-10-CM

## 2020-08-24 LAB — SARS-COV-2, NAA 2 DAY TAT

## 2020-08-24 LAB — NOVEL CORONAVIRUS, NAA: SARS-CoV-2, NAA: NOT DETECTED

## 2021-06-03 ENCOUNTER — Ambulatory Visit
Admission: EM | Admit: 2021-06-03 | Discharge: 2021-06-03 | Disposition: A | Discharge status: Home / Self Care | Payer: BC Managed Care – PPO | Attending: Emergency Medicine | Admitting: Emergency Medicine

## 2021-06-03 ENCOUNTER — Other Ambulatory Visit: Payer: Self-pay

## 2021-06-03 ENCOUNTER — Ambulatory Visit (INDEPENDENT_AMBULATORY_CARE_PROVIDER_SITE_OTHER): Payer: BC Managed Care – PPO

## 2021-06-03 DIAGNOSIS — J069 Acute upper respiratory infection, unspecified: Secondary | ICD-10-CM | POA: Insufficient documentation

## 2021-06-03 DIAGNOSIS — R079 Chest pain, unspecified: Secondary | ICD-10-CM | POA: Diagnosis not present

## 2021-06-03 DIAGNOSIS — J019 Acute sinusitis, unspecified: Secondary | ICD-10-CM

## 2021-06-03 LAB — RAPID INFLUENZA A&B ANTIGENS
Influenza A (ARMC): NEGATIVE
Influenza B (ARMC): NEGATIVE

## 2021-06-03 MED ORDER — HYDROCOD POLST-CPM POLST ER 10-8 MG/5ML PO SUER: 5.0000 mL | Freq: Two times a day (BID) | ORAL | 0 refills | Status: DC | PRN

## 2021-06-03 MED ORDER — BENZONATATE 200 MG PO CAPS: 200.0000 mg | ORAL_CAPSULE | Freq: Three times a day (TID) | ORAL | 0 refills | Status: DC | PRN

## 2021-06-03 MED ORDER — DOXYCYCLINE HYCLATE 100 MG PO CAPS: 100.0000 mg | ORAL_CAPSULE | Freq: Two times a day (BID) | ORAL | 0 refills | Status: AC

## 2021-06-03 MED ORDER — FLUTICASONE PROPIONATE 50 MCG/ACT NA SUSP: 2.0000 | Freq: Every day | NASAL | 0 refills | Status: DC

## 2021-06-03 MED ORDER — IBUPROFEN 600 MG PO TABS: 600.0000 mg | ORAL_TABLET | Freq: Four times a day (QID) | ORAL | 0 refills | Status: DC | PRN

## 2021-06-03 NOTE — ED Provider Notes (Signed)
HPI  SUBJECTIVE:  Mary Suarez is a 30 y.o. female who presents with a headache around her temples starting 3 weeks ago.  She states that she thought she had allergies with itchy, watery eyes, sneezing first, but was ultimately diagnosed with COVID on 6/1.  She states that she got completely better except for the headache.  She now reports dry cough, nasal congestion, sinus pain and pressure, postnasal drip, sore throat from the cough, clear rhinorrhea, shortness of breath, dyspnea on exertion, right lower chest pain present after coughing only.  She denies wheezing, pleuritic chest pain, calf pain or swelling, recent immobilization, surgery, hemoptysis, exogenous estrogen.  She denies fevers, body aches, neck stiffness, photophobia, rash, facial swelling, upper dental pain.  She has been alternating Tylenol and ibuprofen every 4 hours without improvement in her symptoms.  Symptoms are worse with activity.  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  She took ibuprofen within 6 hours of evaluation.  She has had COVID twice this year, history of gestational diabetes and migraines.  No history of PE, DVT, asthma, smoking, hypertension.  LMP: Irregular.  She has a Mirena IUD.  Denies possibility being pregnant.  PMD: None.    Past Medical History:  Diagnosis Date   Anemia    Asthma    childhood asthma   Diabetes mellitus without complication ([place])    Gestational diabetes (First child only)   Headache    Morning sickness     Past Surgical History:  Procedure Laterality Date   CESAREAN SECTION     X 2   CESAREAN SECTION N/A 08/21/2016   Procedure: CESAREAN SECTION/female 7lb 14oz;  Surgeon: Will Bonnet, MD;  Location: [facility] ORS;  Service: Obstetrics;  Laterality: N/A;    Family History  Problem Relation Age of Onset   Healthy Mother    Healthy Father     Social History   Tobacco Use   Smoking status: Never   Smokeless tobacco: Never   Vaping Use   Vaping Use: Never used  Substance Use Topics   Alcohol use: No   Drug use: No    No current facility-administered medications for this encounter.  Current Outpatient Medications:    benzonatate (TESSALON) 200 MG capsule, Take 1 capsule (200 mg total) by mouth 3 (three) times daily as needed for cough., Disp: 30 capsule, Rfl: 0   chlorpheniramine-HYDROcodone (TUSSIONEX PENNKINETIC ER) 10-8 MG/5ML SUER, Take 5 mLs by mouth every 12 (twelve) hours as needed for cough., Disp: 60 mL, Rfl: 0   doxycycline (VIBRAMYCIN) 100 MG capsule, Take 1 capsule (100 mg total) by mouth 2 (two) times daily for 10 days., Disp: 20 capsule, Rfl: 0   fluticasone (FLONASE) 50 MCG/ACT nasal spray, Place 2 sprays into both nostrils daily., Disp: 16 g, Rfl: 0   ibuprofen (ADVIL) 600 MG tablet, Take 1 tablet (600 mg total) by mouth every 6 (six) hours as needed., Disp: 30 tablet, Rfl: 0   levonorgestrel (MIRENA) 20 MCG/24HR IUD, 1 each by Intrauterine route once., Disp: , Rfl:    escitalopram (LEXAPRO) 20 MG tablet, TAKE 1 TABLET BY MOUTH EVERY DAY, Disp: 90 tablet, Rfl: 1  Allergies  Allergen Reactions   Phenergan [Promethazine Hcl] Other (See Comments)    Seizures     ROS  As noted in HPI.   Physical Exam  BP 136/81 (BP Location: Left Arm)   Pulse 96   Temp 98.4  F (36.9 C) (Oral)   Resp 18   Ht $R'5\' 6"'jQ$  (1.676 m)   Wt 73.5 kg   SpO2 100%   BMI 26.15 kg/m   Constitutional: Well developed, well nourished, no acute distress.  Appears ill, tired. Eyes:  EOMI, conjunctiva normal bilaterally HENT: Normocephalic, atraumatic,mucus membranes moist erythematous, swollen turbinates, clear nasal congestion.  No maxillary, frontal sinus tenderness.  Normal oropharynx, no postnasal drip.  No tenderness along the temporal arteries. Neck: No cervical lymphadenopathy, meningismus, trapezial tenderness. Respiratory: Normal inspiratory effort, lungs clear bilaterally.  No chest wall  tenderness. Cardiovascular: Normal rate, regular rhythm, no new murmurs rubs or gallop GI: nondistended skin: No rash, skin intact Musculoskeletal: Calves symmetric, nontender, no edema Neurologic: Alert & oriented x 3, no focal neuro deficits Psychiatric: Speech and behavior appropriate   ED Course   Medications - No data to display  Orders Placed This Encounter  Procedures   Rapid Influenza A&B Antigens    Standing Status:   Standing    Number of Occurrences:   1   DG Chest 2 View    Standing Status:   Standing    Number of Occurrences:   1    Order Specific Question:   Reason for Exam (SYMPTOM  OR DIAGNOSIS REQUIRED)    Answer:   R sided CP recent COVID r/o PNA   Droplet precaution    Standing Status:   Standing    Number of Occurrences:   1    Results for orders placed or performed during the hospital encounter of 06/03/21 (from the past 24 hour(s))  Rapid Influenza A&B Antigens     Status: None   Collection Time: 06/03/21 10:18 AM   Specimen: Flu Kit Nasopharyngeal Swab; Respiratory  Result Value Ref Range   Influenza A ([facility]) NEGATIVE NEGATIVE   Influenza B ([facility]) NEGATIVE NEGATIVE   DG Chest 2 View  Result Date: 06/03/2021 CLINICAL DATA:  Right-sided chest pain EXAM: CHEST - 2 VIEW COMPARISON:  None. FINDINGS: The heart size and mediastinal contours are within normal limits. Both lungs are clear. The visualized skeletal structures are unremarkable. IMPRESSION: No active cardiopulmonary disease. Electronically Signed   By: Kathreen Devoid   On: 06/03/2021 10:50    ED Clinical Impression  1. Viral URI with cough   2. Acute non-recurrent sinusitis, unspecified location      ED Assessment/Plan  Patient with 3 weeks of a headache, recent diagnosis of COVID.  Wonder if she has a secondary ethmoid sinusitis.  She has no evidence of meningitis.  While she is out of the treatment window for influenza, will check for this anyway.  Also checking chest x-ray to make sure she  has not also developed a secondary pneumonia secondary to the right chest pain when coughing.  She denies abdominal pain, pleuritic pain.  Her calves are symmetric.  She is not tachycardic nor hypoxic.  PERC negative.  PE lower in the differential.  Will send home with doxycycline for 10 days which will take care of a sinusitis in addition to pneumonia, Tylenol/ibuprofen, Tessalon, Tussionex, Flonase.  She is to start saline nasal irrigation and Mucinex or Mucinex D for the nasal congestion.  Providing primary care list and ordering assistance in finding a PMD.  Work note for 2 days.  [place] Narcotic database reviewed for this patient, and feel that the risk/benefit ratio today is favorable for proceeding with a prescription for controlled substance.  No opiate prescription in 2 years.  Influenza PCR negative.  Reviewed imaging independently.  Lungs clear per radiology.  see radiology report for full details.  Flu negative.  No obvious consolidation on x-ray.  Patient with a URI, suspect secondary sinusitis.  She will need to follow-up with a primary care provider of her choice ASAP.  Plan as above.  Discussed labs, imaging, MDM, treatment plan, and plan for follow-up with patient. Discussed sn/sx that should prompt return to the ED. patient agrees with plan.   Meds ordered this encounter  Medications   doxycycline (VIBRAMYCIN) 100 MG capsule    Sig: Take 1 capsule (100 mg total) by mouth 2 (two) times daily for 10 days.    Dispense:  20 capsule    Refill:  0   fluticasone (FLONASE) 50 MCG/ACT nasal spray    Sig: Place 2 sprays into both nostrils daily.    Dispense:  16 g    Refill:  0   ibuprofen (ADVIL) 600 MG tablet    Sig: Take 1 tablet (600 mg total) by mouth every 6 (six) hours as needed.    Dispense:  30 tablet    Refill:  0   chlorpheniramine-HYDROcodone (TUSSIONEX PENNKINETIC ER) 10-8 MG/5ML SUER    Sig: Take 5 mLs by mouth every 12 (twelve) hours as needed for cough.    Dispense:   60 mL    Refill:  0   benzonatate (TESSALON) 200 MG capsule    Sig: Take 1 capsule (200 mg total) by mouth 3 (three) times daily as needed for cough.    Dispense:  30 capsule    Refill:  0       *This clinic note was created using [employer]. Therefore, there may be occasional mistakes despite careful proofreading.  ?    Melynda Ripple, MD 06/05/21 1758

## 2021-06-03 NOTE — ED Triage Notes (Signed)
Patient states that she was positive for covid on 06/1. States that she improved for one week. States that she started to feel bad again last week and has been having headache, cough- dry and nasal congestion. States that her job asked her to come get seen before she is allowed to return.

## 2021-06-03 NOTE — Discharge Instructions (Addendum)
Your x-ray is normal and your influenza is negative.  I suspect that you have a sinus infection causing your headache.  Take 600 mg of ibuprofen combined with 1000 mg of Tylenol 3-4 times a day as needed for headache.  Saline nasal irrigation with a NeilMed sinus rinse, Mucinex or Mucinex D, Flonase for the nasal congestion.  I am giving you doxycycline which will cover sinusitis in addition to pneumonia.  Tessalon for the cough during the day, Tussionex for the cough at night.   Here is a list of primary care providers who are taking new patients:  Dr. Elizabeth Sauer [address] [place] [address] [phone]  [facility] at [facility] [address] [place], [place] [address] [phone]  [facility] [place] [address] [place] [address]  [phone]  [facility] [address] [place], [place] [address] [phone]  [facility] [address] [place]  [phone] [place], [place] [address]  Here are clinics/ other resources who will see you if you do not have insurance. Some have certain criteria that you must meet. Call them and find out what they are:  [facility]: [address], [place], [place] [address] Phone: [phone] Hours: First and Third Saturdays of each Month, 9 a.m. - 1 p.m.  [facility]: [address] [place], [place] [address] Phone: [phone] Hours: Tuesday, 4 p.m. - 8 p.m. Thursday, 1 p.m. - 8 p.m. Wednesday, 9 a.m. - [facility] [address], [place], [place] [address] Phone: [phone] Pharmacy Phone Number: [phone] Dental Phone Number: [phone] [facility] Insurance Help: [phone]  Dental Hours: Monday - Thursday, 8 a.m. - 6 p.m.  [facility] [address] [place], [place] [address] Phone: [phone] Pharmacy Phone Number: [phone] [facility] Insurance Help: [phone]  [facility] [address] [place] [place], [place] [address] Phone: [phone] Pharmacy Phone Number: [phone] [facility] Insurance Help: [phone]  [facility] [address] [place], [place] [address] Phone: [phone] [facility] Insurance Help: [phone]   [facility] [address], [place], [place] [address] Phone: [phone]  Go to www.goodrx.com  or www.costplusdrugs.com to look up your medications. This will give you a list of where you can find your prescriptions at the most affordable prices. Or ask the pharmacist what the cash price is, or if they have any other discount programs available to help make your medication more affordable. This can be less expensive than what you would pay with insurance.

## 2021-06-26 ENCOUNTER — Ambulatory Visit (INDEPENDENT_AMBULATORY_CARE_PROVIDER_SITE_OTHER): Payer: BC Managed Care – PPO | Admitting: Advanced Practice Midwife

## 2021-06-26 ENCOUNTER — Other Ambulatory Visit: Payer: Self-pay

## 2021-06-26 ENCOUNTER — Encounter: Payer: Self-pay | Admitting: Advanced Practice Midwife

## 2021-06-26 VITALS — BP 120/80 | Ht 67.0 in | Wt 167.0 lb

## 2021-06-26 DIAGNOSIS — Z01419 Encounter for gynecological examination (general) (routine) without abnormal findings: Secondary | ICD-10-CM

## 2021-06-26 DIAGNOSIS — R102 Pelvic and perineal pain: Secondary | ICD-10-CM

## 2021-06-26 DIAGNOSIS — F419 Anxiety disorder, unspecified: Secondary | ICD-10-CM | POA: Diagnosis not present

## 2021-06-26 DIAGNOSIS — J309 Allergic rhinitis, unspecified: Secondary | ICD-10-CM | POA: Insufficient documentation

## 2021-06-26 DIAGNOSIS — Z8632 Personal history of gestational diabetes: Secondary | ICD-10-CM | POA: Insufficient documentation

## 2021-06-26 MED ORDER — HYDROXYZINE HCL 25 MG PO TABS: 25.0000 mg | ORAL_TABLET | Freq: Three times a day (TID) | ORAL | 2 refills | Status: DC | PRN

## 2021-06-28 NOTE — Progress Notes (Signed)
Gynecology Annual Exam   Date of Service: 06/26/2021  PCP: Patient, No Pcp Per (Inactive)  Chief Complaint:  Chief Complaint  Patient presents with   Annual Exam    History of Present Illness: Patient is a 30 y.o. [id number] presents for annual exam. The patient has complaints today of lower left pelvic pain that began recently and is crampy and intermittent. She denies bowel or bladder complaints. She had Covid for the second time in June and has not felt well generally since then. She has a daily headache and some abdominal and flank pain. 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  After today's exam we made a decision to have a gyn ultrasound for the purpose of evaluating left side pelvic pain and IUD placement due to strings not visible.   LMP: Patient's last menstrual period was 04/23/2021 (within days). Average Interval: irregular, every 1-3 months Duration of flow: 4 days Heavy Menses: yes Clots: no Intermenstrual Bleeding: no Postcoital Bleeding: no Dysmenorrhea: no  The patient is sexually active. She currently uses IUD for contraception. She denies dyspareunia.  The patient does perform self breast exams.  There is no notable family history of breast or ovarian cancer in her family.  The patient wears seatbelts: yes.   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    The patient reports current symptoms of depression that is mainly due to her anxiety. 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    Review of Systems: Review of Systems  Constitutional:  Positive for malaise/fatigue. Negative for chills and fever.  HENT:  Negative for  congestion, ear discharge, ear pain, hearing loss, sinus pain and sore throat.   Eyes:  Negative for blurred vision and double vision.  Respiratory:  Negative for cough, shortness of breath and wheezing.   Cardiovascular:  Negative for chest pain, palpitations and leg swelling.  Gastrointestinal:  Positive for abdominal pain. Negative for blood in stool, constipation, diarrhea, heartburn, melena, nausea and vomiting.  Genitourinary:  Negative for dysuria, flank pain, frequency, hematuria and urgency.  Musculoskeletal:  Negative for back pain, joint pain and myalgias.  Skin:  Negative for itching and rash.  Neurological:  Negative for dizziness, tingling, tremors, sensory change, speech change, focal weakness, seizures, loss of consciousness, weakness and headaches.  Endo/Heme/Allergies:  Negative for environmental allergies. Does not bruise/bleed easily.  Psychiatric/Behavioral:  Negative for depression, hallucinations, memory loss, substance abuse and suicidal ideas. The patient is not nervous/anxious and does not have insomnia.        Positive for anxiety and depression   Past Medical History:  Patient Active Problem List   Diagnosis Date Noted   History of gestational diabetes 06/26/2021   Allergic rhinitis 06/26/2021    Past Surgical History:  Past Surgical History:  Procedure Laterality Date   CESAREAN SECTION     X 2   CESAREAN SECTION N/A 08/21/2016   Procedure: CESAREAN SECTION/female 7lb 14oz;  Surgeon: Conard Novak, MD;  Location: [facility] ORS;  Service: Obstetrics;  Laterality: N/A;    Gynecologic History:  Patient's last menstrual period was 04/23/2021 (within days). Contraception: IUD Last Pap: 2020 Results were: no  abnormalities   Obstetric History: [id number]  Family History:  Family History  Problem Relation Age of Onset   Healthy Mother    Healthy Father     Social History:  Social History   Socioeconomic History   Marital status: Single    Spouse name: Not  on file   Number of children: Not on file   Years of education: Not on file   Highest education level: Not on file  Occupational History   Not on file  Tobacco Use   Smoking status: Never   Smokeless tobacco: Never  Vaping Use   Vaping Use: Never used  Substance and Sexual Activity   Alcohol use: No   Drug use: No   Sexual activity: Yes    Birth control/protection: I.U.D.  Other Topics Concern   Not on file  Social History Narrative   Not on file   Social Determinants of Health   Financial Resource Strain: Not on file  Food Insecurity: Not on file  Transportation Needs: Not on file  Physical Activity: Not on file  Stress: Not on file  Social Connections: Not on file  Intimate Partner Violence: Not on file    Allergies:  Allergies  Allergen Reactions   Peanut Oil Hives   Phenergan [Promethazine Hcl] Other (See Comments)    Seizures    Medications: Prior to Admission medications   Medication Sig Start Date End Date Taking? Authorizing Provider  hydrOXYzine (ATARAX/VISTARIL) 25 MG tablet Take 1 tablet (25 mg total) by mouth every 8 (eight) hours as needed for anxiety. 06/26/21  Yes Tresea Mall, CNM    Physical Exam Vitals: Blood pressure 120/80, height 5\' 7"  (1.702 m), weight 167 lb (75.8 kg).  General: NAD HEENT: normocephalic, anicteric Thyroid: no enlargement, no palpable nodules Pulmonary: No increased work of breathing, CTAB Cardiovascular: RRR, distal pulses 2+ Breast: Breast symmetrical, no tenderness, no palpable nodules or masses, no skin or nipple retraction present, no nipple discharge.  No axillary or supraclavicular lymphadenopathy. Abdomen: NABS, soft, non-tender, non-distended.  Umbilicus without lesions.  No hepatomegaly, splenomegaly or masses palpable. No evidence of hernia  Genitourinary:  External: Normal external female genitalia.  Normal urethral meatus, normal Bartholin's and Skene's glands.    Vagina: Normal vaginal mucosa, no evidence  of prolapse.    Cervix: Grossly normal in appearance, no bleeding. IUD strings not visible  Uterus: Non-enlarged, mobile, normal contour.  No CMT  Adnexa: ovaries non-enlarged, no adnexal masses, increased tenderness to palpation left ovary  Rectal: deferred  Lymphatic: no evidence of inguinal lymphadenopathy Extremities: no edema, erythema, or tenderness Neurologic: Grossly intact Psychiatric: mood appropriate, affect full   Assessment: 30 y.o. G3P3003 routine annual exam  Plan: Problem List Items Addressed This Visit   None Visit Diagnoses     Well woman exam with routine gynecological exam    -  Primary   Relevant Orders   32 PELVIS TRANSVAGINAL NON-OB (TV ONLY)   Anxiety       Relevant Medications   hydrOXYzine (ATARAX/VISTARIL) 25 MG tablet   Pelvic pain in female       Relevant Orders   US PELVIS TRANSVAGINAL NON-OB (TV ONLY)       1) STI screening  was offered and declined  2)  ASCCP guidelines and rationale discussed.  Patient opts for every 3 years screening interval  3) Contraception - the patient is currently using Mirena  IUD.  She is happy with her current form of contraception and plans to continue  4) Routine healthcare maintenance including cholesterol, diabetes screening discussed Declines  5) Rx Atarax PRN for anxiety. Consider therapist for worsening symptoms. Coping methods reviewed.  6) Headache: increase hydration and sleep, OTC magnesium, consider neuro consult for worsening symptoms  7) Return in about 1 week (around 07/03/2021) for gyn u/s and follow up after.   Tresea Mall, CNM [facility] 06/28/2021, 11:16 AM

## 2021-07-18 ENCOUNTER — Other Ambulatory Visit: Payer: Self-pay | Admitting: Advanced Practice Midwife

## 2021-07-18 DIAGNOSIS — N898 Other specified noninflammatory disorders of vagina: Secondary | ICD-10-CM

## 2021-07-18 MED ORDER — FLUCONAZOLE 150 MG PO TABS: 150.0000 mg | ORAL_TABLET | Freq: Once | ORAL | 3 refills | Status: AC

## 2021-07-18 NOTE — Progress Notes (Signed)
Rx Diflucan sent to treat symptoms- per patient itching and thick white discharge.

## 2021-07-30 ENCOUNTER — Other Ambulatory Visit: Payer: Self-pay

## 2021-07-30 ENCOUNTER — Encounter: Payer: Self-pay | Admitting: Obstetrics and Gynecology

## 2021-07-30 ENCOUNTER — Ambulatory Visit (INDEPENDENT_AMBULATORY_CARE_PROVIDER_SITE_OTHER): Payer: BC Managed Care – PPO | Admitting: Obstetrics and Gynecology

## 2021-07-30 VITALS — BP 110/80 | Ht 66.0 in | Wt 165.0 lb

## 2021-07-30 DIAGNOSIS — T8332XD Displacement of intrauterine contraceptive device, subsequent encounter: Secondary | ICD-10-CM | POA: Diagnosis not present

## 2021-07-30 DIAGNOSIS — F419 Anxiety disorder, unspecified: Secondary | ICD-10-CM

## 2021-07-30 DIAGNOSIS — B9689 Other specified bacterial agents as the cause of diseases classified elsewhere: Secondary | ICD-10-CM

## 2021-07-30 DIAGNOSIS — F32A Depression, unspecified: Secondary | ICD-10-CM

## 2021-07-30 DIAGNOSIS — N760 Acute vaginitis: Secondary | ICD-10-CM

## 2021-07-30 DIAGNOSIS — R1032 Left lower quadrant pain: Secondary | ICD-10-CM

## 2021-07-30 LAB — POCT WET PREP WITH KOH
Clue Cells Wet Prep HPF POC: POSITIVE
KOH Prep POC: POSITIVE — AB
Trichomonas, UA: NEGATIVE
Yeast Wet Prep HPF POC: NEGATIVE

## 2021-07-30 MED ORDER — SERTRALINE HCL 50 MG PO TABS: ORAL_TABLET | ORAL | 1 refills | Status: DC

## 2021-07-30 MED ORDER — METRONIDAZOLE 500 MG PO TABS: 500.0000 mg | ORAL_TABLET | Freq: Two times a day (BID) | ORAL | 0 refills | Status: AC

## 2021-07-30 NOTE — Progress Notes (Signed)
Patient, No Pcp Per (Inactive)   Chief Complaint  Patient presents with   Vaginal Discharge    Itching, no odor x 2 weeks   Anxiety    Rx given at annual is not working    HPI:      [name] is a 30 y.o. [phone] whose LMP was No LMP recorded. (Menstrual status: IUD)., presents today for increased vag d/c with irritation/itching, no fishy odor a couple wks ago. Was given diflucan by Tresea Mall without sx relief. Pt tried to get appt and did monistat-1 in the meantime. Itching sx improved, now has a thin d/c, no odor. No prior abx use, but has been in damp bathing suits.   Pt also with LLQ pain, has IUD with lost strings. Erskine Squibb saw pt for annual 06/26/21 and Gyn u/s ordered, scheduled for 08/05/21. Pt states hurts to bend on LT side; pain radiating to LT low back. No GI sx, no urin sx, no fevers. Hasn't tried any meds to treat, no heating pad. She is sex active with dyspareunia since LLQ pain started, unusual for pt. Menses Q1-3 months with IUD, LMP 5/22.  Pt also with hx of anxiety since birth of son several yrs ago. Sx have gotten worse. Did lexapro for a short course PP and had SI, so stopped. Has just been dealing with it since but sx worsening. Erskine Squibb gave Rx atarax 7/22 and pt states it hasn't helped. Hasn't seen a therapist. Is having terrible insomnia this month (quit her job and doesn't start new one till 9/22). Also with frequent panic attacks now. FH anxiety in her dad, he doesn't do any tx.    Past Medical History:  Diagnosis Date   Anemia    Asthma    childhood asthma   Diabetes mellitus without complication (HCC)    Gestational diabetes (First child only)   Headache    Morning sickness     Past Surgical History:  Procedure Laterality Date   CESAREAN SECTION     X 2   CESAREAN SECTION N/A 08/21/2016   Procedure: CESAREAN SECTION/female 7lb 14oz;  Surgeon: Conard Novak, MD;  Location: [facility] ORS;  Service: Obstetrics;  Laterality: N/A;    Family History   Problem Relation Age of Onset   Healthy Mother    Healthy Father     Social History   Socioeconomic History   Marital status: Single    Spouse name: Not on file   Number of children: Not on file   Years of education: Not on file   Highest education level: Not on file  Occupational History   Not on file  Tobacco Use   Smoking status: Never   Smokeless tobacco: Never  Vaping Use   Vaping Use: Never used  Substance and Sexual Activity   Alcohol use: No   Drug use: No   Sexual activity: Yes    Birth control/protection: I.U.D.  Other Topics Concern   Not on file  Social History Narrative   Not on file   Social Determinants of Health   Financial Resource Strain: Not on file  Food Insecurity: Not on file  Transportation Needs: Not on file  Physical Activity: Not on file  Stress: Not on file  Social Connections: Not on file  Intimate Partner Violence: Not on file    Outpatient Medications Prior to Visit  Medication Sig Dispense Refill   hydrOXYzine (ATARAX/VISTARIL) 25 MG tablet Take 1 tablet (25 mg total) by  mouth every 8 (eight) hours as needed for anxiety. 30 tablet 2   No facility-administered medications prior to visit.      ROS:  Review of Systems  Constitutional:  Negative for fatigue, fever and unexpected weight change.  Respiratory:  Negative for cough, shortness of breath and wheezing.   Cardiovascular:  Negative for chest pain, palpitations and leg swelling.  Gastrointestinal:  Negative for blood in stool, constipation, diarrhea, nausea and vomiting.  Endocrine: Negative for cold intolerance, heat intolerance and polyuria.  Genitourinary:  Positive for vaginal discharge. Negative for dyspareunia, dysuria, flank pain, frequency, genital sores, hematuria, menstrual problem, pelvic pain, urgency, vaginal bleeding and vaginal pain.  Musculoskeletal:  Negative for back pain, joint swelling and myalgias.  Skin:  Negative for rash.  Neurological:  Negative  for dizziness, syncope, light-headedness, numbness and headaches.  Hematological:  Negative for adenopathy.  Psychiatric/Behavioral:  Positive for agitation and dysphoric mood. Negative for confusion, sleep disturbance and suicidal ideas. The patient is not nervous/anxious.   BREAST: No symptoms   OBJECTIVE:   Vitals:  BP 110/80   Ht 5\' 6"  (1.676 m)   Wt 165 lb (74.8 kg)   BMI 26.63 kg/m   Physical Exam Vitals reviewed.  Constitutional:      Appearance: She is well-developed.  Pulmonary:     Effort: Pulmonary effort is normal.  Abdominal:     Palpations: Abdomen is soft.     Tenderness: There is abdominal tenderness in the left lower quadrant. There is no guarding or rebound.  Genitourinary:    General: Normal vulva.     Pubic Area: No rash.      Labia:        Right: No rash, tenderness or lesion.        Left: No rash, tenderness or lesion.      Vagina: Normal. No vaginal discharge, erythema or tenderness.     Cervix: Cervical motion tenderness present.     Uterus: Normal. Not enlarged and not tender.      Adnexa: Right adnexa normal.       Right: No mass or tenderness.         Left: Tenderness present. No mass.       Comments: IUD STRINGS NOT IN CX OS Musculoskeletal:        General: Normal range of motion.     Cervical back: Normal range of motion.  Skin:    General: Skin is warm and dry.  Neurological:     General: No focal deficit present.     Mental Status: She is alert and oriented to person, place, and time.  Psychiatric:        Mood and Affect: Mood normal.        Behavior: Behavior normal.        Thought Content: Thought content normal.        Judgment: Judgment normal.    Results: Results for orders placed or performed in visit on 07/30/21 (from the past 24 hour(s))  POCT Wet Prep with KOH     Status: Abnormal   Collection Time: 07/30/21  3:00 PM  Result Value Ref Range   Trichomonas, UA Negative    Clue Cells Wet Prep HPF POC pos    Epithelial Wet  Prep HPF POC     Yeast Wet Prep HPF POC neg    Bacteria Wet Prep HPF POC     RBC Wet Prep HPF POC     WBC Wet Prep HPF POC  KOH Prep POC Positive (A) Negative    GAD 7 : Generalized Anxiety Score 07/30/2021 09/05/2019 08/19/2019  Nervous, Anxious, on Edge 3 0 3  Control/stop worrying 3 1 3   Worry too much - different things 3 1 3   Trouble relaxing 3 3 3   Restless 2 0 3  Easily annoyed or irritable 3 3 3   Afraid - awful might happen 3 1 3   Total GAD 7 Score 20 9 21   Anxiety Difficulty Very difficult Somewhat difficult Very difficult   Depression screen PHQ 2/9 07/30/2021  Decreased Interest 1  Down, Depressed, Hopeless 3  PHQ - 2 Score 4  Altered sleeping 2  Tired, decreased energy 2  Change in appetite 2  Feeling bad or failure about yourself  3  Trouble concentrating 2  Moving slowly or fidgety/restless 2  Suicidal thoughts 1  PHQ-9 Score 18  Difficult doing work/chores Very difficult    Assessment/Plan: Anxiety and depression - Plan: sertraline (ZOLOFT) 50 MG tablet; pos sx for several yrs. Try different SSRI. Rx zoloft (start after completes flagyl for BV). D/C and call 911 if any SI. Recommended seeing therapist. If can't tolerate zoloft, pt to see psych given need for med mgmt and can't do SSRIs. RTO in 7 wks after Rx start for f/u/sooner prn.   BV (bacterial vaginosis) - Plan: metroNIDAZOLE (FLAGYL) 500 MG tablet, POCT Wet Prep with KOH; pos wet prep. Rx flagyl, no EtOH. F/u prn.   LLQ pain--tender on exam. Has GYN u/s for IUD placement and LLQ sx; Jane to f/u with results since ordered by her.   Intrauterine contraceptive device threads lost, subsequent encounter--has GYN u/s sched.    Meds ordered this encounter  Medications   metroNIDAZOLE (FLAGYL) 500 MG tablet    Sig: Take 1 tablet (500 mg total) by mouth 2 (two) times daily for 7 days.    Dispense:  14 tablet    Refill:  0    Order Specific Question:   Supervising Provider    Answer:      sertraline (ZOLOFT) 50 MG tablet    Sig: Take 1/2 tab daily for 6 days, then 1 tab daily    Dispense:  30 tablet    Refill:  1    Order Specific Question:   Supervising Provider    Answer:          Return in about 8 weeks (around 09/24/2021) for anxiety/depression f/u.  Braylan Faul B. Ellison Rieth, PA-C 07/30/2021 3:04 PM

## 2021-08-06 ENCOUNTER — Telehealth: Payer: Self-pay | Admitting: Obstetrics and Gynecology

## 2021-08-06 NOTE — Telephone Encounter (Signed)
Spoke with pt re: BIBC GYN u/s results. IUD in correct location; follicles on ovaries. No path for LLQ pain. BV sx improving with flagyl.  F/u prn.

## 2021-08-14 HISTORY — PX: OTHER SURGICAL HISTORY: SHX169

## 2021-08-21 ENCOUNTER — Other Ambulatory Visit: Payer: Self-pay | Admitting: Obstetrics and Gynecology

## 2021-08-21 DIAGNOSIS — F32A Depression, unspecified: Secondary | ICD-10-CM

## 2021-09-24 ENCOUNTER — Ambulatory Visit: Payer: BC Managed Care – PPO | Admitting: Obstetrics and Gynecology

## 2021-09-25 ENCOUNTER — Other Ambulatory Visit: Payer: Self-pay | Admitting: Obstetrics and Gynecology

## 2021-09-25 DIAGNOSIS — F32A Depression, unspecified: Secondary | ICD-10-CM

## 2021-09-29 ENCOUNTER — Other Ambulatory Visit: Payer: Self-pay | Admitting: Obstetrics and Gynecology

## 2021-09-29 DIAGNOSIS — F32A Depression, unspecified: Secondary | ICD-10-CM

## 2021-09-29 DIAGNOSIS — F419 Anxiety disorder, unspecified: Secondary | ICD-10-CM

## 2021-10-03 ENCOUNTER — Encounter: Payer: Self-pay | Admitting: Obstetrics and Gynecology

## 2021-10-03 ENCOUNTER — Ambulatory Visit (INDEPENDENT_AMBULATORY_CARE_PROVIDER_SITE_OTHER): Payer: Self-pay | Admitting: Obstetrics and Gynecology

## 2021-10-03 ENCOUNTER — Other Ambulatory Visit: Payer: Self-pay

## 2021-10-03 VITALS — BP 104/80 | Ht 66.0 in | Wt 155.0 lb

## 2021-10-03 DIAGNOSIS — F32A Depression, unspecified: Secondary | ICD-10-CM

## 2021-10-03 DIAGNOSIS — F419 Anxiety disorder, unspecified: Secondary | ICD-10-CM

## 2021-10-03 NOTE — Progress Notes (Signed)
Patient, No Pcp Per (Inactive)   Chief Complaint  Patient presents with   Follow-up    Anxiety/depression    HPI:      [name] is a 30 y.o. [phone] whose LMP was No LMP recorded. (Menstrual status: IUD)., presents today for anxiety/depression f/u from 8/22. Pt was given zoloft to start but due to surgery and PO complications, has only been on it for 2 wks. No SI so far, which happened with lexapro in past. Pt's bad dreams have resolved so far. No side effects but no sx change either. Has therapist appt next wk (getting insurance resolved). GAD=20, PHQ=18 8/22.  07/30/21 NOTE: Pt also with hx of anxiety since birth of son several yrs ago. Sx have gotten worse. Did lexapro for a short course PP and had SI, so stopped. Has just been dealing with it since but sx worsening. Erskine Squibb gave Rx atarax 7/22 and pt states it hasn't helped. Hasn't seen a therapist. Is having terrible insomnia this month (quit her job and doesn't start new one till 9/22). Also with frequent panic attacks now. FH anxiety in her dad, he doesn't do any tx.   BV sx from 8/22 resolved with flagyl tx. LLQ pain gone after abdominoplasty surgery. IUD in uterus per Gyn u/s 8/22  Past Medical History:  Diagnosis Date   Anemia    Asthma    childhood asthma   Diabetes mellitus without complication (HCC)    Gestational diabetes (First child only)   Headache    Morning sickness     Past Surgical History:  Procedure Laterality Date   CESAREAN SECTION     X 2   CESAREAN SECTION N/A 08/21/2016   Procedure: CESAREAN SECTION/female 7lb 14oz;  Surgeon: Conard Novak, MD;  Location: [facility] ORS;  Service: Obstetrics;  Laterality: N/A;   OTHER SURGICAL HISTORY  08/14/2021   tummy tuck    Family History  Problem Relation Age of Onset   Healthy Mother    Healthy Father     Social History   Socioeconomic History   Marital status: Single    Spouse name: Not on file   Number of children: Not on file   Years of  education: Not on file   Highest education level: Not on file  Occupational History   Not on file  Tobacco Use   Smoking status: Never   Smokeless tobacco: Never  Vaping Use   Vaping Use: Never used  Substance and Sexual Activity   Alcohol use: No   Drug use: No   Sexual activity: Yes    Birth control/protection: I.U.D.  Other Topics Concern   Not on file  Social History Narrative   Not on file   Social Determinants of Health   Financial Resource Strain: Not on file  Food Insecurity: Not on file  Transportation Needs: Not on file  Physical Activity: Not on file  Stress: Not on file  Social Connections: Not on file  Intimate Partner Violence: Not on file    Outpatient Medications Prior to Visit  Medication Sig Dispense Refill   sertraline (ZOLOFT) 50 MG tablet Take 1/2 tab daily for 6 days, then 1 tab daily 30 tablet 1   hydrOXYzine (ATARAX/VISTARIL) 25 MG tablet Take 1 tablet (25 mg total) by mouth every 8 (eight) hours as needed for anxiety. 30 tablet 2   No facility-administered medications prior to visit.     ROS:  Review of Systems  Constitutional:  Negative for fever.  Gastrointestinal:  Negative for blood in stool, constipation, diarrhea, nausea and vomiting.  Genitourinary:  Negative for dyspareunia, dysuria, flank pain, frequency, hematuria, urgency, vaginal bleeding, vaginal discharge and vaginal pain.  Musculoskeletal:  Negative for back pain.  Skin:  Negative for rash.  Psychiatric/Behavioral:  Positive for agitation and dysphoric mood.   BREAST: No symptoms   OBJECTIVE:   Vitals:  BP 104/80   Ht 5\' 6"  (1.676 m)   Wt 155 lb (70.3 kg)   BMI 25.02 kg/m   Physical Exam Constitutional:      Appearance: Normal appearance.  Pulmonary:     Effort: Pulmonary effort is normal.  Musculoskeletal:        General: Normal range of motion.  Neurological:     Mental Status: She is alert and oriented to person, place, and time.  Psychiatric:         Judgment: Judgment normal.    Results:  GAD 7 : Generalized Anxiety Score 10/03/2021 07/30/2021 09/05/2019 08/19/2019  Nervous, Anxious, on Edge 3 3 0 3  Control/stop worrying 3 3 1 3   Worry too much - different things 3 3 1 3   Trouble relaxing 3 3 3 3   Restless 3 2 0 3  Easily annoyed or irritable 3 3 3 3   Afraid - awful might happen 3 3 1 3   Total GAD 7 Score 21 20 9 21   Anxiety Difficulty Very difficult Very difficult Somewhat difficult Very difficult    Depression screen PHQ 2/9 10/03/2021  Decreased Interest 2  Down, Depressed, Hopeless 2  PHQ - 2 Score 4  Altered sleeping 3  Tired, decreased energy 2  Change in appetite 3  Feeling bad or failure about yourself  3  Trouble concentrating 3  Moving slowly or fidgety/restless 2  Suicidal thoughts 1  PHQ-9 Score 21  Difficult doing work/chores Very difficult    Assessment/Plan: Anxiety and depression--no side effects so far on meds. Cont zoloft. F/u in 5 wks via phone for sx f/u. Will have CMA do GAD/PHQ test over phone. If ok, will RF meds or increase dose based on sx. Has therapist appt.     Return if symptoms worsen or fail to improve.  Rozalia Dino B. Cormick Moss, PA-C 10/03/2021 12:20 PM

## 2022-08-22 ENCOUNTER — Encounter: Payer: Self-pay | Admitting: Emergency Medicine

## 2022-08-22 ENCOUNTER — Ambulatory Visit
Admission: EM | Admit: 2022-08-22 | Discharge: 2022-08-22 | Disposition: A | Discharge status: Home / Self Care | Payer: Self-pay

## 2022-08-22 DIAGNOSIS — F32A Depression, unspecified: Secondary | ICD-10-CM | POA: Insufficient documentation

## 2022-08-22 DIAGNOSIS — F419 Anxiety disorder, unspecified: Secondary | ICD-10-CM | POA: Insufficient documentation

## 2022-08-22 LAB — CBC WITH DIFFERENTIAL/PLATELET
Abs Immature Granulocytes: 0.03 10*3/uL (ref 0.00–0.07)
Basophils Absolute: 0.1 10*3/uL (ref 0.0–0.1)
Basophils Relative: 1 %
Eosinophils Absolute: 0.1 10*3/uL (ref 0.0–0.5)
Eosinophils Relative: 1 %
HCT: 38.7 % (ref 36.0–46.0)
Hemoglobin: 13 g/dL (ref 12.0–15.0)
Immature Granulocytes: 0 %
Lymphocytes Relative: 25 %
Lymphs Abs: 2.4 10*3/uL (ref 0.7–4.0)
MCH: 29.5 pg (ref 26.0–34.0)
MCHC: 33.6 g/dL (ref 30.0–36.0)
MCV: 87.8 fL (ref 80.0–100.0)
Monocytes Absolute: 0.7 10*3/uL (ref 0.1–1.0)
Monocytes Relative: 7 %
Neutro Abs: 6.4 10*3/uL (ref 1.7–7.7)
Neutrophils Relative %: 66 %
Platelets: 252 10*3/uL (ref 150–400)
RBC: 4.41 MIL/uL (ref 3.87–5.11)
RDW: 13.3 % (ref 11.5–15.5)
WBC: 9.7 10*3/uL (ref 4.0–10.5)
nRBC: 0 % (ref 0.0–0.2)

## 2022-08-22 LAB — COMPREHENSIVE METABOLIC PANEL
ALT: 14 U/L (ref 0–44)
AST: 20 U/L (ref 15–41)
Albumin: 4.9 g/dL (ref 3.5–5.0)
Alkaline Phosphatase: 42 U/L (ref 38–126)
Anion gap: 8 (ref 5–15)
BUN: 13 mg/dL (ref 6–20)
CO2: 27 mmol/L (ref 22–32)
Calcium: 9.3 mg/dL (ref 8.9–10.3)
Chloride: 104 mmol/L (ref 98–111)
Creatinine, Ser: 0.85 mg/dL (ref 0.44–1.00)
GFR, Estimated: >60 mL/min (ref >60)
Glucose, Bld: 84 mg/dL (ref 70–99)
Potassium: 3.8 mmol/L (ref 3.5–5.1)
Sodium: 139 mmol/L (ref 135–145)
Total Bilirubin: 0.7 mg/dL (ref 0.3–1.2)
Total Protein: 8 g/dL (ref 6.5–8.1)

## 2022-08-22 LAB — T4, FREE: Free T4: 0.91 ng/dL (ref 0.61–1.12)

## 2022-08-22 LAB — TSH: TSH: 0.439 u[IU]/mL (ref 0.350–4.500)

## 2022-08-22 MED ORDER — BUSPIRONE HCL 7.5 MG PO TABS: 7.5000 mg | ORAL_TABLET | Freq: Two times a day (BID) | ORAL | 1 refills | Status: AC
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
## 2022-08-22 NOTE — ED Triage Notes (Signed)
Patient reports feelings of anxiety and fatigue that started a month ago.  Patient reports her symptoms are worse over the past couple of weeks.  Patient reports diarrhea immediately after she eats something.  Patient reports losing weight.  Patient states that she does not have a PCP and does not have health insurance at this time.  Patient denies cold symptoms or fevers.

## 2022-08-22 NOTE — Discharge Instructions (Addendum)
Your blood work today was very reassuring and did not show any signs of anemia or electrolyte imbalance.  As we discussed, I believe your symptoms are coming from a combination of anxiety and depression.  Your blood work is still pending as to whether or not your thyroid is playing a role.  Start the buspirone 7.5 mg twice daily for control of your anxiety.  This will help with your depression as well.  You need to establish a primary care provider for management of your anxiety and depression going forward.  Also to handle any specialty referrals should your GI symptoms continue.  I recommend [facility] [facility] as they have multiple locations throughout [place] and already community-based clinic with anterior system to help those who are uninsured.  The [place] location is located on [address] and their phone number is [phone].

## 2022-08-23 LAB — T3, FREE: T3, Free: 3.1 pg/mL (ref 2.0–4.4)

## 2023-02-12 IMAGING — CR DG CHEST 2V
2 series · 2 of 2 positions shown · non-contrast
Comparison: None.

CLINICAL DATA: Right-sided chest pain

EXAM:
CHEST - 2 VIEW

[chest pa]
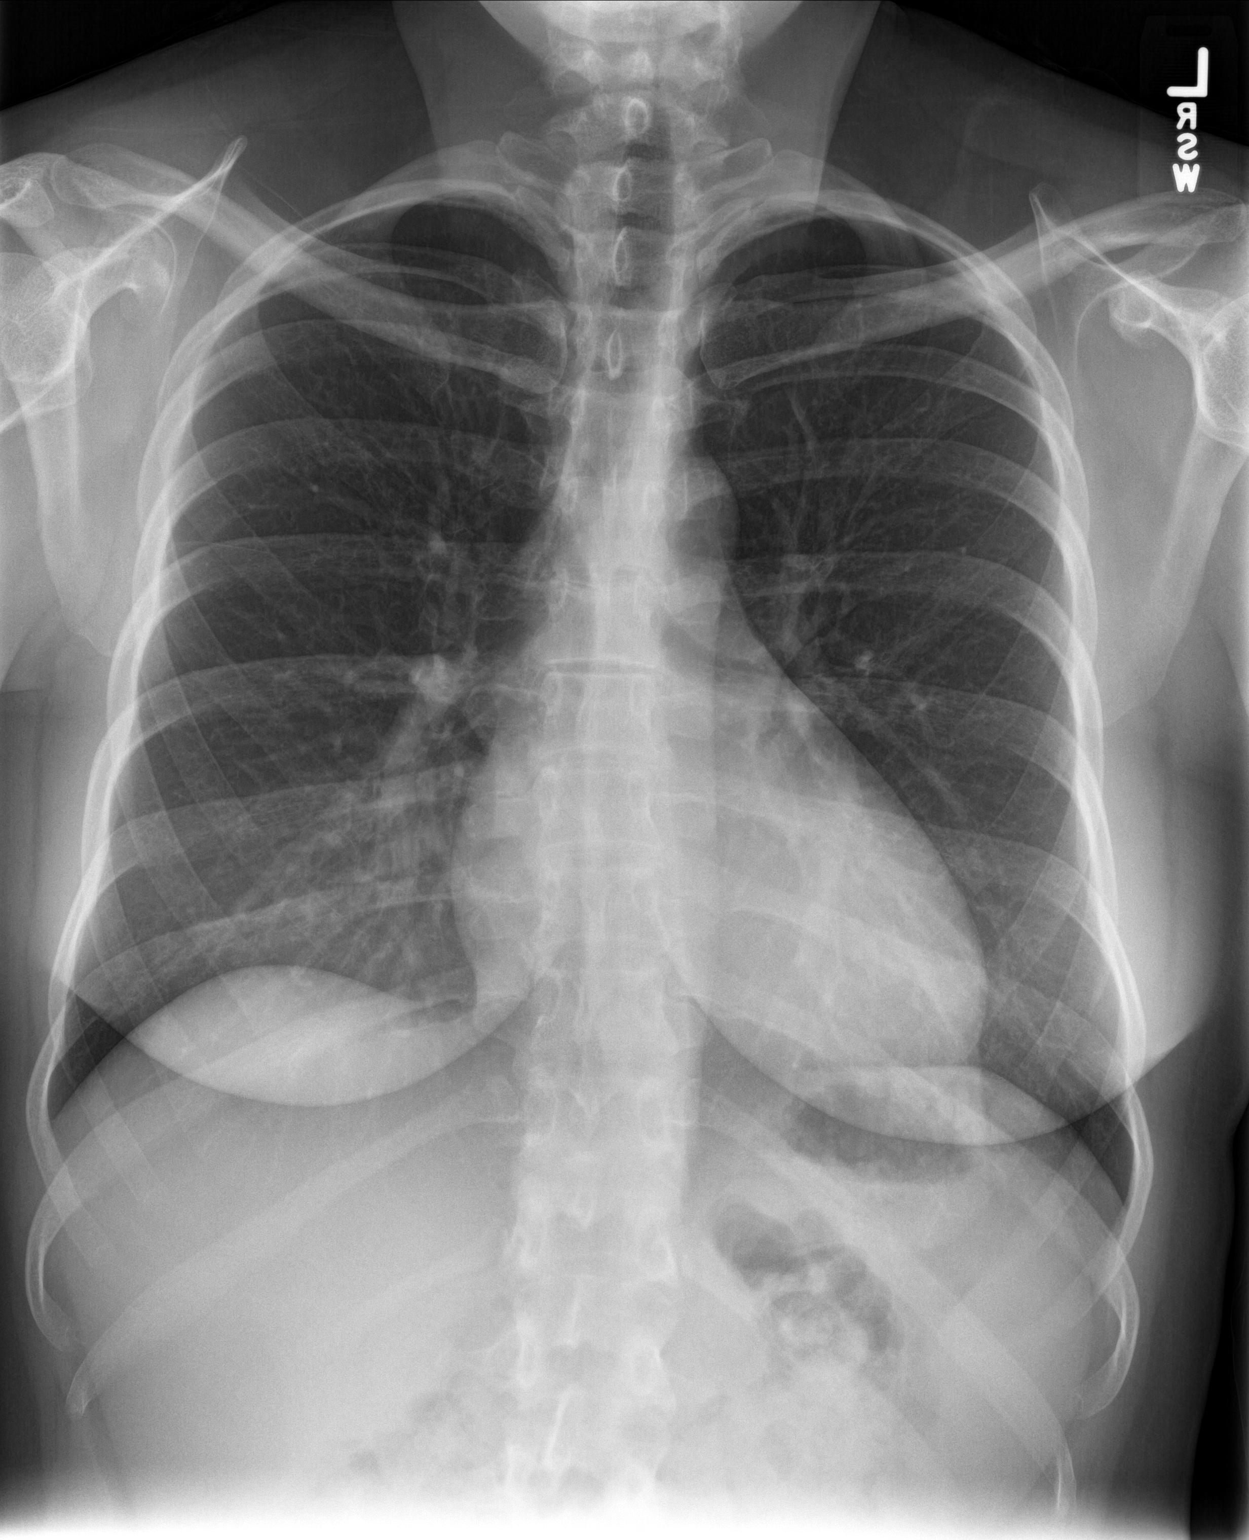

[chest lat]
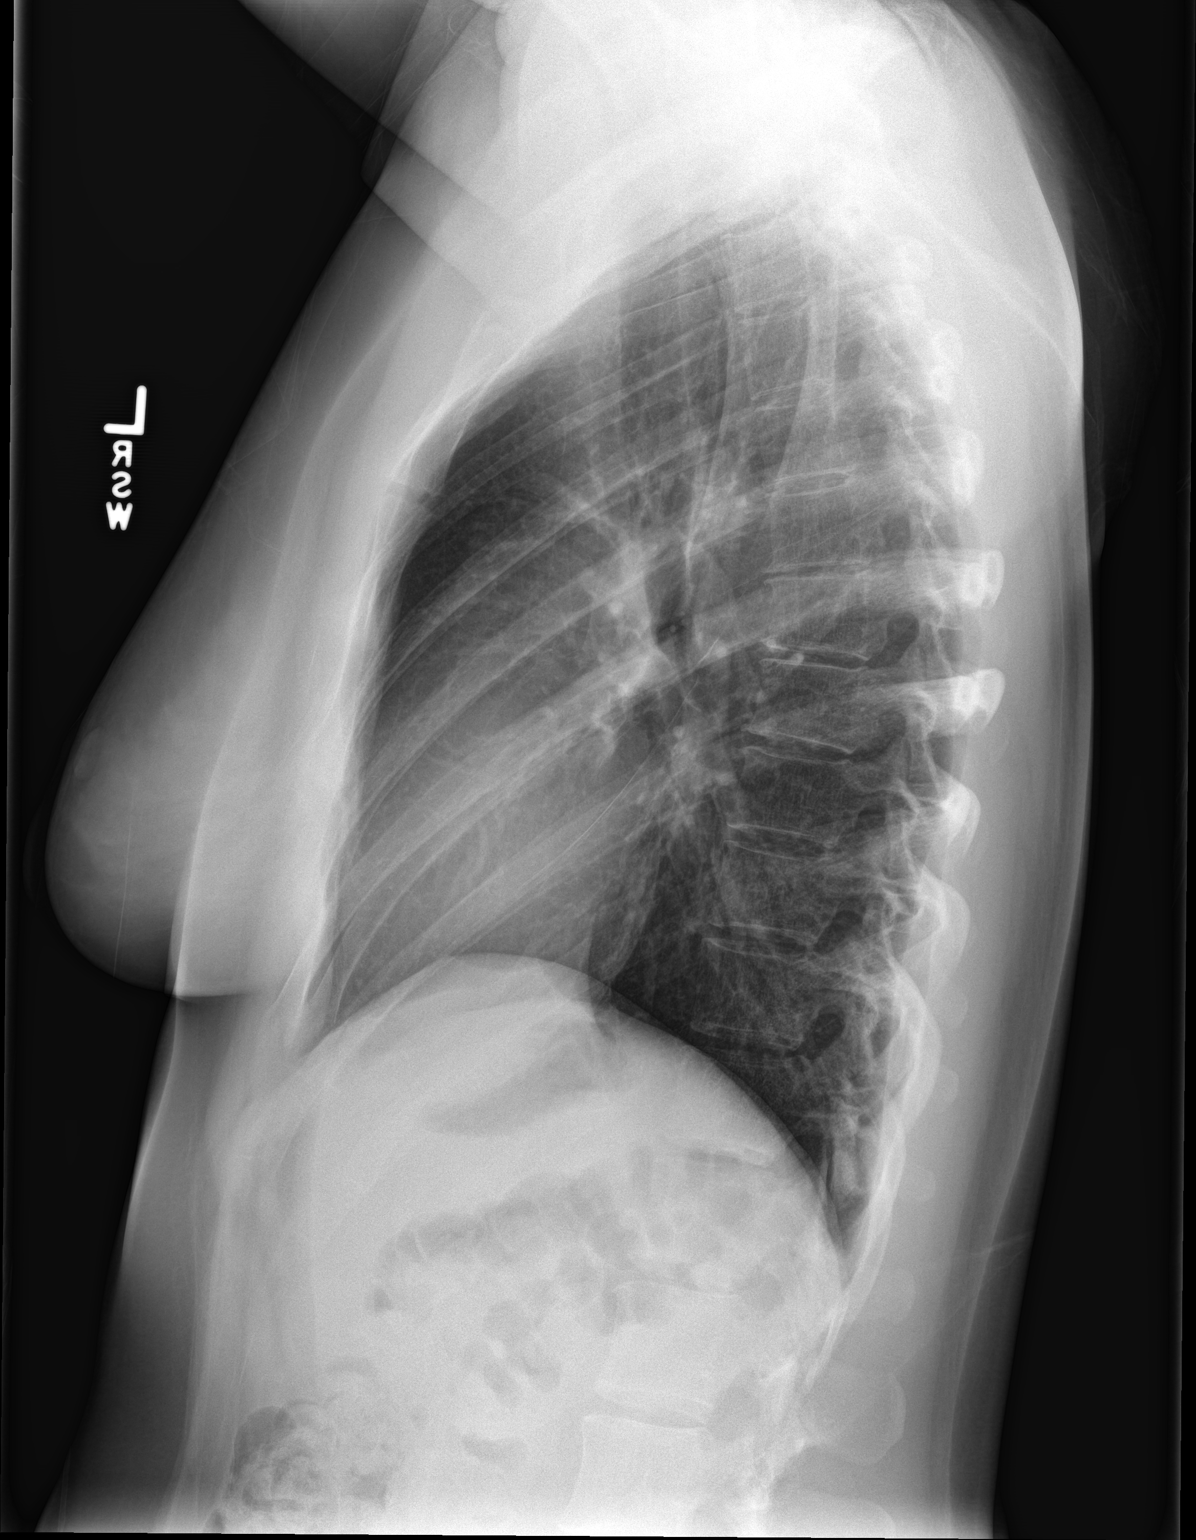

[2 of 2 positions shown; findings below may reference images not displayed]

FINDINGS: The heart size and mediastinal contours are within normal limits.
Both lungs are clear. The visualized skeletal structures are
unremarkable.
IMPRESSION: No active cardiopulmonary disease.
